# Patient Record
Sex: Male | Born: 1950 | Race: White | Hispanic: No | Smoking: Never smoker
Health system: Southern US, Community
[De-identification: ages and names within clinical notes are randomized; demographics above are authoritative.]

## PROBLEM LIST (undated history)

## (undated) DIAGNOSIS — D649 Anemia, unspecified: Secondary | ICD-10-CM

## (undated) DIAGNOSIS — Z960 Presence of urogenital implants: Secondary | ICD-10-CM

## (undated) DIAGNOSIS — I10 Essential (primary) hypertension: Secondary | ICD-10-CM

## (undated) DIAGNOSIS — E119 Type 2 diabetes mellitus without complications: Secondary | ICD-10-CM

## (undated) DIAGNOSIS — E7800 Pure hypercholesterolemia, unspecified: Secondary | ICD-10-CM

## (undated) DIAGNOSIS — Z8719 Personal history of other diseases of the digestive system: Secondary | ICD-10-CM

## (undated) HISTORY — DX: Type 2 diabetes mellitus without complications: E11.9

---

## 2001-02-10 ENCOUNTER — Ambulatory Visit (HOSPITAL_COMMUNITY): Admission: RE | Admit: 2001-02-10 | Discharge: 2001-02-10 | Payer: Self-pay | Admitting: Pulmonary Disease

## 2004-12-15 ENCOUNTER — Ambulatory Visit (HOSPITAL_COMMUNITY): Admission: RE | Admit: 2004-12-15 | Discharge: 2004-12-15 | Payer: Self-pay | Admitting: Internal Medicine

## 2004-12-15 ENCOUNTER — Ambulatory Visit: Payer: Self-pay | Admitting: Internal Medicine

## 2004-12-15 HISTORY — PX: COLONOSCOPY: SHX174

## 2010-03-18 ENCOUNTER — Encounter: Payer: Self-pay | Admitting: Internal Medicine

## 2010-03-22 HISTORY — PX: COLONOSCOPY: SHX174

## 2010-04-03 ENCOUNTER — Telehealth (INDEPENDENT_AMBULATORY_CARE_PROVIDER_SITE_OTHER): Payer: Self-pay

## 2010-04-18 ENCOUNTER — Ambulatory Visit (HOSPITAL_COMMUNITY): Admission: RE | Admit: 2010-04-18 | Discharge: 2010-04-18 | Payer: Self-pay | Admitting: Internal Medicine

## 2010-04-18 ENCOUNTER — Ambulatory Visit: Payer: Self-pay | Admitting: Internal Medicine

## 2010-07-22 NOTE — Letter (Signed)
Summary: Internal Other Eric Guzman  Internal Other Eric Guzman   Imported By: Cloria Spring LPN 16/03/9603 54:09:81  _____________________________________________________________________  External Attachment:    Type:   Image     Comment:   External Document

## 2010-07-22 NOTE — Progress Notes (Signed)
Summary: review med list/problems  Phone Note Outgoing Call   Call placed by: Doris Call placed to: Patient Summary of Call: I called pt and reviewed med list and prolbems. No change in meds or no new problems. Pt still scheduled for colonoscopy on 04/18/2010. Initial call taken by: Cloria Spring LPN,  April 03, 2010 11:51 AM

## 2010-09-03 LAB — GLUCOSE, CAPILLARY: Glucose-Capillary: 280 mg/dL — ABNORMAL HIGH (ref 70–99)

## 2010-11-07 NOTE — Op Note (Signed)
NAME:  Eric Guzman, Eric Guzman                ACCOUNT NO.:  [id number]   MEDICAL RECORD NO.:  [id number]          PATIENT TYPE:  AMB   LOCATION:  DAY                           FACILITY:  [facility]   PHYSICIAN:  R. Roetta Sessions, M.D. DATE OF BIRTH:  Nov 07, 1950   DATE OF PROCEDURE:  12/15/2004  DATE OF DISCHARGE:                                 OPERATIVE REPORT   PROCEDURE PERFORMED:  Colonoscopy.   INDICATIONS FOR PROCEDURE:  The patient is a 60 year old gentleman devoid of  any lower gastrointestinal tract symptoms, positive family history of colon  cancer in his father, who was diagnosed in his 75s.  He is now brought to  colonoscopy.  Colonoscopy is now being done.  This approach has been  discussed with the patient at length.  Potential risks, benefits and  alternatives have been reviewed, questions have been answered, he is  agreeable.  Please see documentation in the medical records.   PROCEDURE NOTE:  Oxygen saturations, blood pressure, pulse and respirations  were monitored throughout the entire procedure.  Conscious sedation Versed 4  mg IV, Demerol 100 mg IV in divided doses.   INSTRUMENT USED:  Olympus video chip system.   FINDINGS:  Digital exam revealed no abnormalities.   ENDOSCOPIC FINDINGS:  Prep was good.   Rectum:  Examination of the rectal mucosa including retroflex view of the  anal verge revealed only a single anal papilla.  Otherwise rectal mucosa  appeared normal.  Colon:  Colonic mucosa was surveyed from the rectosigmoid junction to the  left, transverse and right colon to the area of the appendiceal orifice and  ileocecal valve and cecum.  These structures were well seen and photographed  for the record.  The ileocecal valve was somewhat subtle but it was indeed  the ileocecal valve as I intubated the terminal ileum to 10 cm.  From this  level, the scope was slowly withdrawn.  All previously mentioned mucosal  surfaces were again seen.  The colonic mucosa appeared  normal.  The terminal  ileum mucosa appeared normal.  The patient tolerated the procedure well, was  reacted in endoscopy.   IMPRESSION:  1.  Anal papilla.  Otherwise, normal rectum.  2.  Normal colon.  3.  Normal terminal ileum.   RECOMMENDATIONS:  Repeat high risk screening colonoscopy in 5 years.       RMR/MEDQ  D:  12/15/2004  T:  12/15/2004  Job:  [id number]   cc:   Corrie Mckusick, M.D.  Fax: [phone]

## 2011-06-23 DIAGNOSIS — Z8719 Personal history of other diseases of the digestive system: Secondary | ICD-10-CM

## 2011-06-23 HISTORY — DX: Personal history of other diseases of the digestive system: Z87.19

## 2011-09-11 ENCOUNTER — Ambulatory Visit (INDEPENDENT_AMBULATORY_CARE_PROVIDER_SITE_OTHER): Payer: BC Managed Care – PPO | Admitting: Urology

## 2011-09-11 DIAGNOSIS — N400 Enlarged prostate without lower urinary tract symptoms: Secondary | ICD-10-CM

## 2011-09-11 DIAGNOSIS — R972 Elevated prostate specific antigen [PSA]: Secondary | ICD-10-CM

## 2011-09-11 DIAGNOSIS — N478 Other disorders of prepuce: Secondary | ICD-10-CM

## 2011-09-11 DIAGNOSIS — N471 Phimosis: Secondary | ICD-10-CM

## 2014-02-14 ENCOUNTER — Encounter: Payer: Self-pay | Admitting: *Deleted

## 2014-03-20 ENCOUNTER — Other Ambulatory Visit: Payer: Self-pay

## 2014-03-20 ENCOUNTER — Encounter: Payer: Self-pay | Admitting: Gastroenterology

## 2014-03-20 ENCOUNTER — Ambulatory Visit (INDEPENDENT_AMBULATORY_CARE_PROVIDER_SITE_OTHER): Payer: Medicare Other | Admitting: Gastroenterology

## 2014-03-20 VITALS — BP 141/73 | HR 97 | Temp 98.4°F | Ht 66.0 in | Wt 163.0 lb

## 2014-03-20 DIAGNOSIS — Z800 Family history of malignant neoplasm of digestive organs: Secondary | ICD-10-CM

## 2014-03-20 DIAGNOSIS — D509 Iron deficiency anemia, unspecified: Secondary | ICD-10-CM

## 2014-03-20 DIAGNOSIS — D649 Anemia, unspecified: Secondary | ICD-10-CM

## 2014-03-20 MED ORDER — PEG-KCL-NACL-NASULF-NA ASC-C 100 G PO SOLR: 1.0000 | ORAL | Status: DC

## 2014-03-20 NOTE — Assessment & Plan Note (Signed)
63 year old male with new onset anemia but without overt signs of GI bleeding. Labs unavailable at time of appointment but have been requested. No concerning upper or lower GI symptoms. Last colonoscopy in 2011 normal; he does have a family history of colon cancer in his father, diagnosed in his late 43s. Recommend at a minimum proceeding with a colonoscopy +/- EGD with Dr. Gala Romney due to new onset anemia. Requesting labs to determine if IDA evident.   Proceed with TCS +/- EGD with Dr. Gala Romney in near future: the risks, benefits, and alternatives have been discussed with the patient in detail. The patient states understanding and desires to proceed.

## 2014-03-20 NOTE — Progress Notes (Signed)
    Primary Care Physician:  GOLDING, JOHN CABOT, MD Primary Gastroenterologist:  Dr. Rourk   Chief Complaint  Patient presents with  . Anemia    HPI:   Eric Guzman presents today secondary to anemia. Labs not available at time of visit. Was told he had low iron. No hematochezia. No melena. No abdominal pain. No changes in bowel habits. Walking, exercising, intentional weight loss of 10 lbs over past year or so. Father diagnosed with colon cancer in his late 70s. Last colonoscopy in 2011 normal.   Past Medical History  Diagnosis Date  . Diabetes     Past Surgical History  Procedure Laterality Date  . Colonoscopy  12/15/2004    RMR:.  Anal papilla.  Otherwise, normal rectum and colon  . Colonoscopy  Oct 2011    Dr. Rourk: normal    Current Outpatient Prescriptions  Medication Sig Dispense Refill  . aspirin 81 MG tablet Take 81 mg by mouth daily.      . atorvastatin (LIPITOR) 20 MG tablet Take 20 mg by mouth daily.      . lisinopril (PRINIVIL,ZESTRIL) 20 MG tablet Take 20 mg by mouth daily.      . Multiple Vitamin (MULTIVITAMIN) tablet Take 1 tablet by mouth daily. WITH IRON      . pioglitazone-metformin (ACTOPLUS MET) 15-850 MG per tablet Take 1 tablet by mouth 3 (three) times daily.      . sitaGLIPtin (JANUVIA) 100 MG tablet Take 100 mg by mouth daily.      . peg 3350 powder (MOVIPREP) 100 G SOLR Take 1 kit (200 g total) by mouth as directed.  1 kit  0   No current facility-administered medications for this visit.    Allergies as of 03/20/2014  . (No Known Allergies)    Family History  Problem Relation Age of Onset  . Colon cancer Father     diagnosed in his 70s, deceased in his 80s    History   Social History  . Marital Status: Single    Spouse Name: N/A    Number of Children: N/A  . 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 Smoking status: Never Smoker   . Smokeless tobacco: Not on  file  . Alcohol Use: No  . Drug Use: No  . Sexual Activity: Not on file   Other Topics Concern  . Not on file   Social History Narrative  . No narrative on file    Review of Systems: Gen: +fatigue CV: Denies chest pain, heart palpitations, peripheral edema, syncope.  Resp: Denies shortness of breath at rest or with exertion. Denies wheezing or cough.  GI: see HPI GU : Denies urinary burning, urinary frequency, urinary hesitancy MS: Denies joint pain, muscle weakness, cramps, or limitation of movement.  Derm: Denies rash, itching, dry skin Psych: Denies depression, anxiety, memory loss, and confusion Heme: Denies bruising, bleeding, and enlarged lymph nodes.  Physical Exam: BP 141/73  Pulse 97  Temp(Src) 98.4 F (36.9 C) (Oral)  Ht 5' 6" (1.676 m)  Wt 163 lb (73.936 kg)  BMI 26.32 kg/m2 General:   Alert and oriented. Pleasant and cooperative. Well-nourished and well-developed.  Head:  Normocephalic and atraumatic. Eyes:  Without icterus, sclera clear and conjunctiva pink.  Ears:  Normal auditory acuity. Nose:  No deformity, discharge,  or lesions. Mouth:  No deformity or lesions, oral mucosa pink.    Lungs:  Clear to auscultation bilaterally. No wheezes, rales, or rhonchi. No distress.  Heart:  S1, S2 present without murmurs appreciated.  Abdomen:  +BS, soft, non-tender and non-distended. No HSM noted. No guarding or rebound. No masses appreciated.  Rectal:  Deferred  Msk:  Symmetrical without gross deformities. Normal posture. Extremities:  Without clubbing or edema. Neurologic:  Alert and  oriented x4;  grossly normal neurologically. Skin:  Intact without significant lesions or rashes. Psych:  Alert and cooperative. Normal mood and affect.    

## 2014-03-20 NOTE — Patient Instructions (Signed)
We have scheduled you for a colonoscopy with a possible upper endoscopy with Dr. Gala Romney.  Further recommendations to follow.

## 2014-03-20 NOTE — Progress Notes (Addendum)
Received outside labs. Hgb normal at 14 in Feb 2015. Most current dated Aug 2015. Normocytic anemia. Hgb 11.7. Hct 35.1. Ferritin normal at 71, iron marginally low at 41. TIBC normal at 332. %sat low at 12. I do not have previous iron studies to compare. He has dropped close to 3 grams in 6 months. Proceed with TCS+/- EGD.

## 2014-03-20 NOTE — Assessment & Plan Note (Signed)
In 1st degree relative but at an advanced age. Last 2 colonoscopies normal. Due to new onset anemia, proceeding with early interval colonoscopy.

## 2014-03-20 NOTE — Progress Notes (Signed)
cc'ed to pcp °

## 2014-04-03 ENCOUNTER — Encounter (HOSPITAL_COMMUNITY): Payer: Self-pay | Admitting: Pharmacy Technician

## 2014-04-11 ENCOUNTER — Encounter (HOSPITAL_COMMUNITY): Payer: Self-pay | Admitting: *Deleted

## 2014-04-11 ENCOUNTER — Encounter (HOSPITAL_COMMUNITY)
Admission: RE | Disposition: A | Discharge status: Home / Self Care | Payer: Self-pay | Source: Ambulatory Visit | Attending: Internal Medicine

## 2014-04-11 ENCOUNTER — Ambulatory Visit (HOSPITAL_COMMUNITY)
Admission: RE | Admit: 2014-04-11 | Discharge: 2014-04-11 | Disposition: A | Discharge status: Home / Self Care | Payer: Medicare Other | Source: Ambulatory Visit | Attending: Internal Medicine | Admitting: Internal Medicine

## 2014-04-11 DIAGNOSIS — D509 Iron deficiency anemia, unspecified: Secondary | ICD-10-CM | POA: Diagnosis present

## 2014-04-11 DIAGNOSIS — K2970 Gastritis, unspecified, without bleeding: Secondary | ICD-10-CM | POA: Insufficient documentation

## 2014-04-11 DIAGNOSIS — E119 Type 2 diabetes mellitus without complications: Secondary | ICD-10-CM | POA: Insufficient documentation

## 2014-04-11 DIAGNOSIS — K2100 Gastro-esophageal reflux disease with esophagitis, without bleeding: Secondary | ICD-10-CM

## 2014-04-11 DIAGNOSIS — K648 Other hemorrhoids: Secondary | ICD-10-CM | POA: Diagnosis not present

## 2014-04-11 DIAGNOSIS — Z7982 Long term (current) use of aspirin: Secondary | ICD-10-CM | POA: Insufficient documentation

## 2014-04-11 DIAGNOSIS — Z800 Family history of malignant neoplasm of digestive organs: Secondary | ICD-10-CM | POA: Insufficient documentation

## 2014-04-11 DIAGNOSIS — K257 Chronic gastric ulcer without hemorrhage or perforation: Secondary | ICD-10-CM

## 2014-04-11 DIAGNOSIS — K259 Gastric ulcer, unspecified as acute or chronic, without hemorrhage or perforation: Secondary | ICD-10-CM | POA: Insufficient documentation

## 2014-04-11 HISTORY — DX: Pure hypercholesterolemia, unspecified: E78.00

## 2014-04-11 HISTORY — DX: Anemia, unspecified: D64.9

## 2014-04-11 HISTORY — PX: COLONOSCOPY: SHX5424

## 2014-04-11 HISTORY — PX: ESOPHAGOGASTRODUODENOSCOPY: SHX5428

## 2014-04-11 HISTORY — DX: Essential (primary) hypertension: I10

## 2014-04-11 LAB — GLUCOSE, CAPILLARY: Glucose-Capillary: 142 mg/dL — ABNORMAL HIGH (ref 70–99)

## 2014-04-11 SURGERY — COLONOSCOPY
Anesthesia: Moderate Sedation

## 2014-04-11 MED ORDER — MEPERIDINE HCL 100 MG/ML IJ SOLN
INTRAMUSCULAR | Status: AC
  Filled 2014-04-11: qty 2

## 2014-04-11 MED ORDER — MIDAZOLAM HCL 5 MG/5ML IJ SOLN
INTRAMUSCULAR | Status: AC
  Filled 2014-04-11: qty 10

## 2014-04-11 MED ORDER — ONDANSETRON HCL 4 MG/2ML IJ SOLN
INTRAMUSCULAR | Status: DC | PRN
  Administered 2014-04-11: 4 mg via INTRAVENOUS

## 2014-04-11 MED ORDER — ONDANSETRON HCL 4 MG/2ML IJ SOLN
INTRAMUSCULAR | Status: AC
  Filled 2014-04-11: qty 2

## 2014-04-11 MED ORDER — LIDOCAINE VISCOUS 2 % MT SOLN
OROMUCOSAL | Status: DC | PRN
  Administered 2014-04-11: 4 mL via OROMUCOSAL

## 2014-04-11 MED ORDER — SODIUM CHLORIDE 0.9 % IV SOLN
INTRAVENOUS | Status: DC
  Administered 2014-04-11: 09:00:00 via INTRAVENOUS

## 2014-04-11 MED ORDER — STERILE WATER FOR IRRIGATION IR SOLN
Status: DC | PRN
  Administered 2014-04-11: 09:00:00

## 2014-04-11 MED ORDER — LIDOCAINE VISCOUS 2 % MT SOLN
OROMUCOSAL | Status: AC
  Filled 2014-04-11: qty 15

## 2014-04-11 MED ORDER — MEPERIDINE HCL 100 MG/ML IJ SOLN
INTRAMUSCULAR | Status: DC | PRN
  Administered 2014-04-11: 25 mg via INTRAVENOUS
  Administered 2014-04-11: 50 mg via INTRAVENOUS
  Administered 2014-04-11: 25 mg via INTRAVENOUS

## 2014-04-11 MED ORDER — MIDAZOLAM HCL 5 MG/5ML IJ SOLN
INTRAMUSCULAR | Status: DC | PRN
  Administered 2014-04-11: 1 mg via INTRAVENOUS
  Administered 2014-04-11 (×2): 2 mg via INTRAVENOUS

## 2014-04-11 NOTE — Op Note (Signed)
[facility] [address] [place], [address]   ENDOSCOPY PROCEDURE REPORT  PATIENT: Eric, Guzman  MR#: [id number] BIRTHDATE: 1950-07-30 , 1  yrs. old GENDER: male ENDOSCOPIST: R.  Garfield Cornea, MD FACP FACG REFERRED BY:  Sharilyn Sites, M.D. PROCEDURE DATE:  2014-04-14 PROCEDURE:  EGD w/ biopsy INDICATIONS:  anemia-mixed iron deficiency; negative colonoscopy. MEDICATIONS: Versed 5 mg IV and Demerol 100 mg IV in divided doses. Xylocaine gel orally ASA CLASS:      Class II  CONSENT: The risks, benefits, limitations, alternatives and imponderables have been discussed.  The potential for biopsy, esophogeal dilation, etc. have also been reviewed.  Questions have been answered.  All parties agreeable.  Please see the history and physical in the medical record for more information.  DESCRIPTION OF PROCEDURE: After the risks benefits and alternatives of the procedure were thoroughly explained, informed consent was obtained.  The EC-3890Li ([id number]) endoscope was introduced through the mouth and advanced to the second portion of the duodenum , limited by Without limitations. The instrument was slowly withdrawn as the mucosa was fully examined.    Examination of the soft tissue mucosa revealed a 5 mm inserted "V." erosions straddling the GE junction.  No Barrett's esophagus.  The tumor.  Stomach empty.  (1) 8 mm stellate shaped ulcer crater in the body mucosa along the proximal greater curvature.  It appeared to be benign.  The remainder of the gastric mucosa appeared normal. Patent pylorus.  Examination bulb and second were multiple erosions scattered throughout the posterior bulb and second portion of the duodenum.  Biopsies the gastric ulcer and abnormal duodenum taken for histologic study.  Retroflexed views revealed as previously described.     The scope was then withdrawn from the patient and the procedure completed.  COMPLICATIONS: There were no  immediate complications.  ENDOSCOPIC IMPRESSION: Erosive reflux esophagitis. Gastric ulcer  -  status post biopsy. Duodenal erosions-status post biopsy  RECOMMENDATIONS: Avoid nonsteroidals much as possible. Begin Protonix 40 mg twice daily. Followup on pathology. Avoid nonsteroidal agents as much as possible. See colonoscopy report.  REPEAT EXAM:  eSigned:  R. Garfield Cornea, MD Rosalita Chessman [facility] 14-Apr-2014 9:59 AM    CC:  CPT CODES: ICD CODES:  The ICD and CPT codes recommended by this software are interpretations from the data that the clinical staff has captured with the software.  The verification of the translation of this report to the ICD and CPT codes and modifiers is the sole responsibility of the health care institution and practicing physician where this report was generated.  [place]. will not be held responsible for the validity of the ICD and CPT codes included on this report.  AMA assumes no liability for data contained or not contained herein. CPT is a Designer, television/film set of the [employer].  PATIENT NAME:  Eric, Guzman MR#: [id number]

## 2014-04-11 NOTE — Interval H&P Note (Signed)
History and Physical Interval Note:  04/11/2014 9:07 AM  Eric Guzman  has presented today for surgery, with the diagnosis of anemia  The various methods of treatment have been discussed with the patient and family. After consideration of risks, benefits and other options for treatment, the patient has consented to  Procedure(s) with comments: COLONOSCOPY (N/A) - 0930 ESOPHAGOGASTRODUODENOSCOPY (EGD) (N/A) as a surgical intervention .  The patient's history has been reviewed, patient examined, no change in status, stable for surgery.  I have reviewed the patient's chart and labs.  Questions were answered to the patient's satisfaction.     Trinna Kunst  No change. Colonoscopy possible EGD to follow.The risks, benefits, limitations, imponderables and alternatives regarding both EGD and colonoscopy have been reviewed with the patient. Questions have been answered. All parties agreeable.

## 2014-04-11 NOTE — H&P (View-Only) (Signed)
    Primary Care Physician:  GOLDING, JOHN CABOT, MD Primary Gastroenterologist:  Dr. Rourk   Chief Complaint  Patient presents with  . Anemia    HPI:   Ming C Schwer presents today secondary to anemia. Labs not available at time of visit. Was told he had low iron. No hematochezia. No melena. No abdominal pain. No changes in bowel habits. Walking, exercising, intentional weight loss of 10 lbs over past year or so. Father diagnosed with colon cancer in his late 70s. Last colonoscopy in 2011 normal.   Past Medical History  Diagnosis Date  . Diabetes     Past Surgical History  Procedure Laterality Date  . Colonoscopy  12/15/2004    RMR:.  Anal papilla.  Otherwise, normal rectum and colon  . Colonoscopy  Oct 2011    Dr. Rourk: normal    Current Outpatient Prescriptions  Medication Sig Dispense Refill  . aspirin 81 MG tablet Take 81 mg by mouth daily.      . atorvastatin (LIPITOR) 20 MG tablet Take 20 mg by mouth daily.      . lisinopril (PRINIVIL,ZESTRIL) 20 MG tablet Take 20 mg by mouth daily.      . Multiple Vitamin (MULTIVITAMIN) tablet Take 1 tablet by mouth daily. WITH IRON      . pioglitazone-metformin (ACTOPLUS MET) 15-850 MG per tablet Take 1 tablet by mouth 3 (three) times daily.      . sitaGLIPtin (JANUVIA) 100 MG tablet Take 100 mg by mouth daily.      . peg 3350 powder (MOVIPREP) 100 G SOLR Take 1 kit (200 g total) by mouth as directed.  1 kit  0   No current facility-administered medications for this visit.    Allergies as of 03/20/2014  . (No Known Allergies)    Family History  Problem Relation Age of Onset  . Colon cancer Father     diagnosed in his 70s, deceased in his 80s    History   Social History  . Marital Status: Single    Spouse Name: N/A    Number of Children: N/A  . 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 Smoking status: Never Smoker   . Smokeless tobacco: Not on  file  . Alcohol Use: No  . Drug Use: No  . Sexual Activity: Not on file   Other Topics Concern  . Not on file   Social History Narrative  . No narrative on file    Review of Systems: Gen: +fatigue CV: Denies chest pain, heart palpitations, peripheral edema, syncope.  Resp: Denies shortness of breath at rest or with exertion. Denies wheezing or cough.  GI: see HPI GU : Denies urinary burning, urinary frequency, urinary hesitancy MS: Denies joint pain, muscle weakness, cramps, or limitation of movement.  Derm: Denies rash, itching, dry skin Psych: Denies depression, anxiety, memory loss, and confusion Heme: Denies bruising, bleeding, and enlarged lymph nodes.  Physical Exam: BP 141/73  Pulse 97  Temp(Src) 98.4 F (36.9 C) (Oral)  Ht 5' 6" (1.676 m)  Wt 163 lb (73.936 kg)  BMI 26.32 kg/m2 General:   Alert and oriented. Pleasant and cooperative. Well-nourished and well-developed.  Head:  Normocephalic and atraumatic. Eyes:  Without icterus, sclera clear and conjunctiva pink.  Ears:  Normal auditory acuity. Nose:  No deformity, discharge,  or lesions. Mouth:  No deformity or lesions, oral mucosa pink.    Lungs:  Clear to auscultation bilaterally. No wheezes, rales, or rhonchi. No distress.  Heart:  S1, S2 present without murmurs appreciated.  Abdomen:  +BS, soft, non-tender and non-distended. No HSM noted. No guarding or rebound. No masses appreciated.  Rectal:  Deferred  Msk:  Symmetrical without gross deformities. Normal posture. Extremities:  Without clubbing or edema. Neurologic:  Alert and  oriented x4;  grossly normal neurologically. Skin:  Intact without significant lesions or rashes. Psych:  Alert and cooperative. Normal mood and affect.    

## 2014-04-11 NOTE — Op Note (Signed)
[facility] [address] [place], [address]   COLONOSCOPY PROCEDURE REPORT  PATIENT: Eric Guzman, Eric Guzman  MR#: [id number] BIRTHDATE: [birth year] , 58  yrs. old GENDER: male ENDOSCOPIST: R.  Garfield Cornea, MD FACP [facility] REFERRED EH:UDJS Hilma Favors, M.D. PROCEDURE DATE:  05/02/14 PROCEDURE:   Colonoscopy, diagnostic INDICATIONS:mixed iron deficiency anemia; positive family history of colon cancer in a first relative but at an advanced age; Hemoccult negative stool. MEDICATIONS: Versed and 4 mg daily and Demerol 75 mg IV in divided doses.  Zofran 4 mg IV ASA CLASS:       Class II  CONSENT: The risks, benefits, alternatives and imponderables including but not limited to bleeding, perforation as well as the possibility of a missed lesion have been reviewed.  The potential for biopsy, lesion removal, etc. have also been discussed. Questions have been answered.  All parties agreeable.  Please see the history and physical in the medical record for more information.  DESCRIPTION OF PROCEDURE:   After the risks benefits and alternatives of the procedure were thoroughly explained, informed consent was obtained.  The digital rectal exam revealed no abnormalities of the rectum.   The EC-3890Li ([id number])  endoscope was introduced through the anus and advanced to the terminal ileum which was intubated for a short distance. No adverse events experienced.   The quality of the prep was adequate.  The instrument was then slowly withdrawn as the colon was fully examined.      COLON FINDINGS: Anal papilla and internal hemorrhoids; otherwise normal-appearing rectal mucosa.  Normal-appearing colonic mucosa. The distal 10 cm of terminal ileal mucosa also appeared normal. Retroflexion was performed. .  Withdrawal time=7 minutes 0 seconds.  The scope was withdrawn and the procedure completed. COMPLICATIONS: There were no immediate complications.  ENDOSCOPIC IMPRESSION: Anal  papilla and internal hemorrhoids; otherwise normal ileocolonoscopy  RECOMMENDATIONS: See EGD report  eSigned:  R. Garfield Cornea, MD Rosalita Chessman [facility] 05/02/14 9:38 AM   cc:  CPT CODES: ICD CODES:  The ICD and CPT codes recommended by this software are interpretations from the data that the clinical staff has captured with the software.  The verification of the translation of this report to the ICD and CPT codes and modifiers is the sole responsibility of the health care institution and practicing physician where this report was generated.  [place]. will not be held responsible for the validity of the ICD and CPT codes included on this report.  AMA assumes no liability for data contained or not contained herein. CPT is a Designer, television/film set of the [employer].  PATIENT NAME:  Eric Guzman, Eric Guzman MR#: [id number]

## 2014-04-11 NOTE — Discharge Instructions (Signed)
Colonoscopy Discharge Instructions  Read the instructions outlined below and refer to this sheet in the next few weeks. These discharge instructions provide you with general information on caring for yourself after you leave the hospital. Your doctor may also give you specific instructions. While your treatment has been planned according to the most current medical practices available, unavoidable complications occasionally occur. If you have any problems or questions after discharge, call Dr. Gala Romney at [phone]. ACTIVITY  You may resume your regular activity, but move at a slower pace for the next 24 hours.   Take frequent rest periods for the next 24 hours.   Walking will help get rid of the air and reduce the bloated feeling in your belly (abdomen).   No driving for 24 hours (because of the medicine (anesthesia) used during the test).    Do not sign any important legal documents or operate any machinery for 24 hours (because of the anesthesia used during the test).  NUTRITION  Drink plenty of fluids.   You may resume your normal diet as instructed by your doctor.   Begin with a light meal and progress to your normal diet. Heavy or fried foods are harder to digest and may make you feel sick to your stomach (nauseated).   Avoid alcoholic beverages for 24 hours or as instructed.  MEDICATIONS  You may resume your normal medications unless your doctor tells you otherwise.  WHAT YOU CAN EXPECT TODAY  Some feelings of bloating in the abdomen.   Passage of more gas than usual.   Spotting of blood in your stool or on the toilet paper.  IF YOU HAD POLYPS REMOVED DURING THE COLONOSCOPY:  No aspirin products for 7 days or as instructed.   No alcohol for 7 days or as instructed.   Eat a soft diet for the next 24 hours.  FINDING OUT THE RESULTS OF YOUR TEST Not all test results are available during your visit. If your test results are not back during the visit, make an appointment  with your caregiver to find out the results. Do not assume everything is normal if you have not heard from your caregiver or the medical facility. It is important for you to follow up on all of your test results.  SEEK IMMEDIATE MEDICAL ATTENTION IF:  You have more than a spotting of blood in your stool.   Your belly is swollen (abdominal distention).   You are nauseated or vomiting.   You have a temperature over 101.   You have abdominal pain or discomfort that is severe or gets worse throughout the day.  EGD Discharge instructions Please read the instructions outlined below and refer to this sheet in the next few weeks. These discharge instructions provide you with general information on caring for yourself after you leave the hospital. Your doctor may also give you specific instructions. While your treatment has been planned according to the most current medical practices available, unavoidable complications occasionally occur. If you have any problems or questions after discharge, please call your doctor. ACTIVITY You may resume your regular activity but move at a slower pace for the next 24 hours.  Take frequent rest periods for the next 24 hours.  Walking will help expel (get rid of) the air and reduce the bloated feeling in your abdomen.  No driving for 24 hours (because of the anesthesia (medicine) used during the test).  You may shower.  Do not sign any important legal documents or operate any machinery  for 24 hours (because of the anesthesia used during the test).  NUTRITION Drink plenty of fluids.  You may resume your normal diet.  Begin with a light meal and progress to your normal diet.  Avoid alcoholic beverages for 24 hours or as instructed by your caregiver.  MEDICATIONS You may resume your normal medications unless your caregiver tells you otherwise.  WHAT YOU CAN EXPECT TODAY You may experience abdominal discomfort such as a feeling of fullness or gas pains.    FOLLOW-UP Your doctor will discuss the results of your test with you.  SEEK IMMEDIATE MEDICAL ATTENTION IF ANY OF THE FOLLOWING OCCUR: Excessive nausea (feeling sick to your stomach) and/or vomiting.  Severe abdominal pain and distention (swelling).  Trouble swallowing.  Temperature over 101 F (37.8 C).  Rectal bleeding or vomiting of blood.    Peptic ulcer and GERD information provided  Begin Protonix 40 mg twice daily  Further recommendations to follow pending review of pathology report  Avoid nonsteroidal agents including aspirin as much as possible     Peptic Ulcer A peptic ulcer is a sore in the lining of your esophagus (esophageal ulcer), stomach (gastric ulcer), or in the first part of your small intestine (duodenal ulcer). The ulcer causes erosion into the deeper tissue. CAUSES  Normally, the lining of the stomach and the small intestine protects itself from the acid that digests food. The protective lining can be damaged by:  An infection caused by a bacterium called Helicobacter pylori (H. pylori).  Regular use of nonsteroidal anti-inflammatory drugs (NSAIDs), such as ibuprofen or aspirin.  Smoking tobacco. Other risk factors include being older than 7, drinking alcohol excessively, and having a family history of ulcer disease.  SYMPTOMS   Burning pain or gnawing in the area between the chest and the belly button.  Heartburn.  Nausea and vomiting.  Bloating. The pain can be worse on an empty stomach and at night. If the ulcer results in bleeding, it can cause:  Black, tarry stools.  Vomiting of bright red blood.  Vomiting of coffee-ground-looking materials. DIAGNOSIS  A diagnosis is usually made based upon your history and an exam. Other tests and procedures may be performed to find the cause of the ulcer. Finding a cause will help determine the best treatment. Tests and procedures may include:  Blood tests, stool tests, or breath tests to check for  the bacterium H. pylori.  An upper gastrointestinal (GI) series of the esophagus, stomach, and small intestine.  An endoscopy to examine the esophagus, stomach, and small intestine.  A biopsy. TREATMENT  Treatment may include:  Eliminating the cause of the ulcer, such as smoking, NSAIDs, or alcohol.  Medicines to reduce the amount of acid in your digestive tract.  Antibiotic medicines if the ulcer is caused by the H. pylori bacterium.  An upper endoscopy to treat a bleeding ulcer.  Surgery if the bleeding is severe or if the ulcer created a hole somewhere in the digestive system. HOME CARE INSTRUCTIONS   Avoid tobacco, alcohol, and caffeine. Smoking can increase the acid in the stomach, and continued smoking will impair the healing of ulcers.  Avoid foods and drinks that seem to cause discomfort or aggravate your ulcer.  Only take medicines as directed by your caregiver. Do not substitute over-the-counter medicines for prescription medicines without talking to your caregiver.  Keep any follow-up appointments and tests as directed. SEEK MEDICAL CARE IF:   Your do not improve within 7 days of starting treatment.  You have ongoing indigestion or heartburn. SEEK IMMEDIATE MEDICAL CARE IF:     You have sudden, sharp, or persistent abdominal pain.  You have bloody or dark black, tarry stools.  You vomit blood or vomit that looks like coffee grounds.  You become light-headed, weak, or feel faint.  You become sweaty or clammy. MAKE SURE YOU:   Understand these instructions.  Will watch your condition.  Will get help right away if you are not doing well or get worse. Document Released: 06/05/2000 Document Revised: 10/23/2013 Document Reviewed: 01/06/2012  Gastroesophageal Reflux Disease, Adult Gastroesophageal reflux disease (GERD) happens when acid from your stomach flows up into the esophagus. When acid comes in contact with the esophagus, the acid causes soreness  (inflammation) in the esophagus. Over time, GERD may create small holes (ulcers) in the lining of the esophagus. CAUSES   Increased body weight. This puts pressure on the stomach, making acid rise from the stomach into the esophagus.  Smoking. This increases acid production in the stomach.  Drinking alcohol. This causes decreased pressure in the lower esophageal sphincter (valve or ring of muscle between the esophagus and stomach), allowing acid from the stomach into the esophagus.  Late evening meals and a full stomach. This increases pressure and acid production in the stomach.  A malformed lower esophageal sphincter. Sometimes, no cause is found. SYMPTOMS   Burning pain in the lower part of the mid-chest behind the breastbone and in the mid-stomach area. This may occur twice a week or more often.  Trouble swallowing.  Sore throat.  Dry cough.  Asthma-like symptoms including chest tightness, shortness of breath, or wheezing. DIAGNOSIS  Your caregiver may be able to diagnose GERD based on your symptoms. In some cases, X-rays and other tests may be done to check for complications or to check the condition of your stomach and esophagus. TREATMENT  Your caregiver may recommend over-the-counter or prescription medicines to help decrease acid production. Ask your caregiver before starting or adding any new medicines.  HOME CARE INSTRUCTIONS   Change the factors that you can control. Ask your caregiver for guidance concerning weight loss, quitting smoking, and alcohol consumption.  Avoid foods and drinks that make your symptoms worse, such as:  Caffeine or alcoholic drinks.  Chocolate.  Peppermint or mint flavorings.  Garlic and onions.  Spicy foods.  Citrus fruits, such as oranges, lemons, or limes.  Tomato-based foods such as sauce, chili, salsa, and pizza.  Fried and fatty foods.  Avoid lying down for the 3 hours prior to your bedtime or prior to taking a nap.  Eat  small, frequent meals instead of large meals.  Wear loose-fitting clothing. Do not wear anything tight around your waist that causes pressure on your stomach.  Raise the head of your bed 6 to 8 inches with wood blocks to help you sleep. Extra pillows will not help.  Only take over-the-counter or prescription medicines for pain, discomfort, or fever as directed by your caregiver.  Do not take aspirin, ibuprofen, or other nonsteroidal anti-inflammatory drugs (NSAIDs). SEEK IMMEDIATE MEDICAL CARE IF:   You have pain in your arms, neck, jaw, teeth, or back.  Your pain increases or changes in intensity or duration.  You develop nausea, vomiting, or sweating (diaphoresis).  You develop shortness of breath, or you faint.  Your vomit is green, yellow, black, or looks like coffee grounds or blood.  Your stool is red, bloody, or black. These symptoms could be signs of other problems, such  as heart disease, gastric bleeding, or esophageal bleeding. MAKE SURE YOU:   Understand these instructions.  Will watch your condition.  Will get help right away if you are not doing well or get worse. Document Released: 03/18/2005 Document Revised: 08/31/2011 Document Reviewed: 12/26/2010 [facility] Patient Information 2015 [place], [place]. This information is not intended to replace advice given to you by your health care provider. Make sure you discuss any questions you have with your health care provider.  [employer] Patient Information 2015 [place]. This information is not intended to replace advice given to you by your health care provider. Make sure you discuss any questions you have with your health care provider.

## 2014-04-13 ENCOUNTER — Encounter (HOSPITAL_COMMUNITY): Payer: Self-pay | Admitting: Internal Medicine

## 2014-04-15 ENCOUNTER — Encounter: Payer: Self-pay | Admitting: Internal Medicine

## 2014-04-16 ENCOUNTER — Telehealth: Payer: Self-pay

## 2014-04-16 ENCOUNTER — Encounter: Payer: Self-pay | Admitting: Internal Medicine

## 2014-04-16 NOTE — Telephone Encounter (Signed)
Letter mailed to pt.  

## 2014-04-16 NOTE — Telephone Encounter (Signed)
Letter from: Daneil Dolin  Reason for Letter: Results Review  Send letter to patient.  Send copy of letter with path to referring provider and PCP.   Needs ov w us3 months

## 2014-04-16 NOTE — Telephone Encounter (Signed)
APPT MADE, LETTER SENT °

## 2014-05-03 LAB — CBC
%SAT: 12
HCT: 42 %
HEMATOCRIT: 35 %
HEMOGLOBIN: 11.7 g/dL
HGB: 14.1 g/dL
IRON: 41
TIBC: 332
UIBC: 291

## 2014-07-17 ENCOUNTER — Ambulatory Visit (INDEPENDENT_AMBULATORY_CARE_PROVIDER_SITE_OTHER): Payer: Medicare Other | Admitting: Gastroenterology

## 2014-07-17 ENCOUNTER — Other Ambulatory Visit: Payer: Self-pay

## 2014-07-17 ENCOUNTER — Encounter: Payer: Self-pay | Admitting: Gastroenterology

## 2014-07-17 VITALS — BP 124/71 | HR 96 | Temp 97.4°F | Ht 66.0 in | Wt 157.8 lb

## 2014-07-17 DIAGNOSIS — K279 Peptic ulcer, site unspecified, unspecified as acute or chronic, without hemorrhage or perforation: Secondary | ICD-10-CM

## 2014-07-17 DIAGNOSIS — D649 Anemia, unspecified: Secondary | ICD-10-CM

## 2014-07-17 NOTE — Progress Notes (Signed)
Referring Provider: Sharilyn Sites, MD Primary Care Physician:  Purvis Kilts, MD Primary GI: Dr. Gala Romney   Chief Complaint  Patient presents with  . Follow-up    HPI:   Eric Guzman is a 64 y.o. male presenting today with a history normocytic anemia, normal ferritin and low normal iron at 41. Due to dropping Hgb from 14 in Feb 2015 last year to 11.7 in Aug 2015, he underwent EGD/TCS. PUD  noted on EGD in Oct 2015. Negative H.pylori. Presents today to set up surveillance EGD to document ulcer healing. Only belches with exercise. Otherwise no reflux symptoms. No constipation or diarrhea. Protonix BID. Colonoscopy up-to-date as of Oct 2015. Next screening 2020 due to family history of colon cancer.   Past Medical History  Diagnosis Date  . Diabetes   . Hypercholesteremia   . Anemia     Past Surgical History  Procedure Laterality Date  . Colonoscopy  12/15/2004    RMR:.  Anal papilla.  Otherwise, normal rectum and colon  . Colonoscopy  Oct 2011    Dr. Gala Romney: normal  . Colonoscopy N/A 04/11/2014    Dr. Gala Romney: Anal papilla and internal hemorroids; otherwise normal ileocolonoscopy  . Esophagogastroduodenoscopy N/A 04/11/2014    Dr. Gala Romney: erosive reflux esophagitis.  Gastric ulcer- status post biopsy. duodenal erosions-status post biopsy, negative H.pylori    Current Outpatient Prescriptions  Medication Sig Dispense Refill  . atorvastatin (LIPITOR) 20 MG tablet Take 20 mg by mouth daily.    Marland Kitchen lisinopril (PRINIVIL,ZESTRIL) 20 MG tablet Take 20 mg by mouth daily.    . Multiple Vitamin (MULTIVITAMIN) tablet Take 1 tablet by mouth daily. WITH IRON    . pantoprazole (PROTONIX) 40 MG tablet Take 40 mg by mouth 2 (two) times daily.    . pioglitazone-metformin (ACTOPLUS MET) 15-850 MG per tablet Take 1 tablet by mouth 3 (three) times daily.    . sitaGLIPtin (JANUVIA) 100 MG tablet Take 100 mg by mouth daily.     No current facility-administered medications for this visit.     Allergies as of 07/17/2014  . (No Known Allergies)    Family History  Problem Relation Age of Onset  . Colon cancer Father     diagnosed in his 27s, deceased in his 57s    History   Social History  . Marital Status: Widowed    Spouse Name: N/A    Number of Children: N/A  . Years of Education: N/A   Occupational History  . retired     Dealer   Social History Main Topics  . Smoking status: Never Smoker   . Smokeless tobacco: None  . Alcohol Use: No  . Drug Use: No  . Sexual Activity: None   Other Topics Concern  . None   Social History Narrative    Review of Systems: As mentioned in HPI.   Physical Exam: BP 124/71 mmHg  Pulse 96  Temp(Src) 97.4 F (36.3 C) (Oral)  Ht _0  (1.676 m)  Wt 157 lb 12.8 oz (71.578 kg)  BMI 25.48 kg/m2 General:   Alert and oriented. No distress noted. Pleasant and cooperative.  Head:  Normocephalic and atraumatic. Eyes:  Conjuctiva clear without scleral icterus. Mouth:  Oral mucosa pink and moist. Good dentition. No lesions. Heart:  S1, S2 present without murmurs, rubs, or gallops. Regular rate and rhythm. Abdomen:  +BS, soft, non-tender and non-distended. No rebound or guarding. No HSM or masses noted. Msk:  Symmetrical without gross  deformities. Normal posture. Extremities:  Without edema. Neurologic:  Alert and  oriented x4;  grossly normal neurologically. Skin:  Intact without significant lesions or rashes. Psych:  Alert and cooperative. Normal mood and affect.  Lab Results  Component Value Date   HGB 11.7 02/05/2014   HCT 35 02/05/2014   Lab Results  Component Value Date   IRON 41 02/05/2014   TIBC 332 02/05/2014

## 2014-07-17 NOTE — Patient Instructions (Signed)
We have set you up for an upper endoscopy with Dr. Gala Romney to verify ulcer healing.  Further recommendations to follow.

## 2014-07-24 ENCOUNTER — Telehealth: Payer: Self-pay | Admitting: Gastroenterology

## 2014-07-24 ENCOUNTER — Encounter: Payer: Self-pay | Admitting: Gastroenterology

## 2014-07-24 NOTE — Telephone Encounter (Signed)
Let's check CBC, iron, ferritin now. I forgot to order this at his visit. Thanks!

## 2014-07-24 NOTE — Assessment & Plan Note (Signed)
64 year old male with recent findings of PUD on EGD in Oct 2015, likely NSAID related. Negative H.pylori on file. Needs surveillance EGD to document ulcer healing. Will continue PPI BID until EGD completed, then likely down to once daily.   Proceed with upper endoscopy in the near future with Dr. Gala Romney. The risks, benefits, and alternatives have been discussed in detail with patient. They have stated understanding and desire to proceed.

## 2014-07-24 NOTE — Assessment & Plan Note (Signed)
Normocytic mild anemia, with PUD likely explaining. Recheck CBC, iron, ferritin now. Colonoscopy up-to-date and due again in 2020.

## 2014-07-25 ENCOUNTER — Other Ambulatory Visit: Payer: Self-pay

## 2014-07-25 ENCOUNTER — Other Ambulatory Visit: Payer: Self-pay | Admitting: Gastroenterology

## 2014-07-25 DIAGNOSIS — D649 Anemia, unspecified: Secondary | ICD-10-CM

## 2014-07-25 NOTE — Telephone Encounter (Signed)
Mailed letter and lab order to the pt.

## 2014-07-30 ENCOUNTER — Telehealth: Payer: Self-pay | Admitting: Internal Medicine

## 2014-07-30 NOTE — Telephone Encounter (Signed)
PATIENT CAME IN OFFICE INQUIRING ABOUT LABS HE RECEIVED PAPER WORK FOR.  WANTS TO KNOW IF YOU RECEIVED THE LAB RESULTS FROM January HE HAD AT FROM GOLDING'S OFFICE.  IF HE STILL NEEDS TO HAVE WHAT WAS MAILED TO HIM PLEASE CALL AND LET HIM KNOW.  [phone]  CELL [phone]

## 2014-08-01 NOTE — Progress Notes (Signed)
cc'ed to pcp °

## 2014-08-03 NOTE — Telephone Encounter (Signed)
Labs from [facility] are in Anna's cart. I tried to call both pts home number and cell number- NA and could not leave a message.   Vicente Males, please check and see if you still need these labs for the pt.

## 2014-08-07 NOTE — Telephone Encounter (Signed)
Yes, he needs iron, ferritin, TIBC. I only have CBC. Hgb 12.27 Jun 2014. Normocytic anemia.

## 2014-08-08 ENCOUNTER — Encounter (HOSPITAL_COMMUNITY)
Admission: RE | Disposition: A | Discharge status: Home / Self Care | Payer: Self-pay | Source: Ambulatory Visit | Attending: Internal Medicine

## 2014-08-08 ENCOUNTER — Encounter (HOSPITAL_COMMUNITY): Payer: Self-pay | Admitting: *Deleted

## 2014-08-08 ENCOUNTER — Ambulatory Visit (HOSPITAL_COMMUNITY)
Admission: RE | Admit: 2014-08-08 | Discharge: 2014-08-08 | Disposition: A | Discharge status: Home / Self Care | Payer: Medicare Other | Source: Ambulatory Visit | Attending: Internal Medicine | Admitting: Internal Medicine

## 2014-08-08 DIAGNOSIS — E119 Type 2 diabetes mellitus without complications: Secondary | ICD-10-CM | POA: Diagnosis not present

## 2014-08-08 DIAGNOSIS — E780 Pure hypercholesterolemia: Secondary | ICD-10-CM | POA: Diagnosis not present

## 2014-08-08 DIAGNOSIS — D649 Anemia, unspecified: Secondary | ICD-10-CM | POA: Insufficient documentation

## 2014-08-08 DIAGNOSIS — K279 Peptic ulcer, site unspecified, unspecified as acute or chronic, without hemorrhage or perforation: Secondary | ICD-10-CM | POA: Insufficient documentation

## 2014-08-08 DIAGNOSIS — Z8711 Personal history of peptic ulcer disease: Secondary | ICD-10-CM | POA: Diagnosis not present

## 2014-08-08 HISTORY — PX: ESOPHAGOGASTRODUODENOSCOPY: SHX5428

## 2014-08-08 LAB — GLUCOSE, CAPILLARY: Glucose-Capillary: 163 mg/dL — ABNORMAL HIGH (ref 70–99)

## 2014-08-08 SURGERY — EGD (ESOPHAGOGASTRODUODENOSCOPY)
Anesthesia: Moderate Sedation

## 2014-08-08 MED ORDER — MIDAZOLAM HCL 5 MG/5ML IJ SOLN
INTRAMUSCULAR | Status: AC
  Filled 2014-08-08: qty 10

## 2014-08-08 MED ORDER — ONDANSETRON HCL 4 MG/2ML IJ SOLN
INTRAMUSCULAR | Status: AC
  Filled 2014-08-08: qty 2

## 2014-08-08 MED ORDER — MEPERIDINE HCL 100 MG/ML IJ SOLN
INTRAMUSCULAR | Status: DC | PRN
  Administered 2014-08-08: 25 mg via INTRAVENOUS
  Administered 2014-08-08: 50 mg via INTRAVENOUS

## 2014-08-08 MED ORDER — LIDOCAINE VISCOUS 2 % MT SOLN
OROMUCOSAL | Status: DC | PRN
  Administered 2014-08-08: 3 mL via OROMUCOSAL

## 2014-08-08 MED ORDER — ONDANSETRON HCL 4 MG/2ML IJ SOLN
INTRAMUSCULAR | Status: DC | PRN
  Administered 2014-08-08: 4 mg via INTRAVENOUS

## 2014-08-08 MED ORDER — LIDOCAINE VISCOUS 2 % MT SOLN
OROMUCOSAL | Status: AC
  Filled 2014-08-08: qty 15

## 2014-08-08 MED ORDER — STERILE WATER FOR IRRIGATION IR SOLN
Status: DC | PRN
  Administered 2014-08-08: 10:00:00

## 2014-08-08 MED ORDER — SODIUM CHLORIDE 0.9 % IV SOLN
INTRAVENOUS | Status: DC
  Administered 2014-08-08: 10:00:00 via INTRAVENOUS

## 2014-08-08 MED ORDER — MEPERIDINE HCL 100 MG/ML IJ SOLN
INTRAMUSCULAR | Status: AC
  Filled 2014-08-08: qty 2

## 2014-08-08 MED ORDER — MIDAZOLAM HCL 5 MG/5ML IJ SOLN
INTRAMUSCULAR | Status: DC | PRN
  Administered 2014-08-08 (×2): 2 mg via INTRAVENOUS

## 2014-08-08 NOTE — Op Note (Signed)
[facility] [address] [place], [address]   ENDOSCOPY PROCEDURE REPORT  PATIENT: Eric, Guzman  MR#: [id number] BIRTHDATE: 1950-12-04 , 1  yrs. old GENDER: male ENDOSCOPIST: R.  Garfield Cornea, MD FACP FACG REFERRED BY:  Sharilyn Sites, M.D. PROCEDURE DATE:  08-31-2014 PROCEDURE:  EGD, diagnostic INDICATIONS:  History of peptic ulcer disease. MEDICATIONS: Versed 4 mg IV and Demerol 75 mg IV in divided doses. Xylocaine gel orally.  Zofran 4 mg IV. ASA CLASS:      Class II  CONSENT: The risks, benefits, limitations, alternatives and imponderables have been discussed.  The potential for biopsy, esophogeal dilation, etc. have also been reviewed.  Questions have been answered.  All parties agreeable.  Please see the history and physical in the medical record for more information.  DESCRIPTION OF PROCEDURE: After the risks benefits and alternatives of the procedure were thoroughly explained, informed consent was obtained.  The EG-[id number] ([id number]) endoscope was introduced through the mouth and advanced to the second portion of the duodenum , limited by Without limitations. The instrument was slowly withdrawn as the mucosa was fully examined.    Normal esophagus.  Stomach empty.  Normal-appearing gastric mucosa. Previously noted gastric ulcers completely healed.  Patent pylorus. Examination of the bulb and second portion revealed no mucosal abnormalities.  Retroflexed views revealed no abnormalities. The scope was then withdrawn from the patient and the procedure completed.  COMPLICATIONS: There were no immediate complications.  ENDOSCOPIC IMPRESSION: Normal EGD. Previously noted gastric ulcers completely healed.   RECOMMENDATIONS: Need to recent CBC.  Patient may decrease Protonix to 40 mg once daily.  REPEAT EXAM:  eSigned:  R. Garfield Cornea, MD Rosalita Chessman [facility] 31-Aug-2014 10:35 AM    CC:  CPT CODES: ICD CODES:  The ICD and CPT codes recommended by  this software are interpretations from the data that the clinical staff has captured with the software.  The verification of the translation of this report to the ICD and CPT codes and modifiers is the sole responsibility of the health care institution and practicing physician where this report was generated.  [facility]. will not be held responsible for the validity of the ICD and CPT codes included on this report.  AMA assumes no liability for data contained or not contained herein. CPT is a Designer, television/film set of the [employer].  PATIENT NAME:  Eric, Guzman MR#: [id number]

## 2014-08-08 NOTE — H&P (View-Only) (Signed)
    Referring Provider: Golding, John, MD Primary Care Physician:  GOLDING, JOHN CABOT, MD Primary GI: Dr. Rourk   Chief Complaint  Patient presents with  . Follow-up    HPI:   Eric Guzman is a 64 y.o. male presenting today with a history normocytic anemia, normal ferritin and low normal iron at 41. Due to dropping Hgb from 14 in Feb 2015 last year to 11.7 in Aug 2015, he underwent EGD/TCS. PUD  noted on EGD in Oct 2015. Negative H.pylori. Presents today to set up surveillance EGD to document ulcer healing. Only belches with exercise. Otherwise no reflux symptoms. No constipation or diarrhea. Protonix BID. Colonoscopy up-to-date as of Oct 2015. Next screening 2020 due to family history of colon cancer.   Past Medical History  Diagnosis Date  . Diabetes   . Hypercholesteremia   . Anemia     Past Surgical History  Procedure Laterality Date  . Colonoscopy  12/15/2004    RMR:.  Anal papilla.  Otherwise, normal rectum and colon  . Colonoscopy  Oct 2011    Dr. Rourk: normal  . Colonoscopy N/A 04/11/2014    Dr. Rourk: Anal papilla and internal hemorroids; otherwise normal ileocolonoscopy  . Esophagogastroduodenoscopy N/A 04/11/2014    Dr. Rourk: erosive reflux esophagitis.  Gastric ulcer- status post biopsy. duodenal erosions-status post biopsy, negative H.pylori    Current Outpatient Prescriptions  Medication Sig Dispense Refill  . atorvastatin (LIPITOR) 20 MG tablet Take 20 mg by mouth daily.    . lisinopril (PRINIVIL,ZESTRIL) 20 MG tablet Take 20 mg by mouth daily.    . Multiple Vitamin (MULTIVITAMIN) tablet Take 1 tablet by mouth daily. WITH IRON    . pantoprazole (PROTONIX) 40 MG tablet Take 40 mg by mouth 2 (two) times daily.    . pioglitazone-metformin (ACTOPLUS MET) 15-850 MG per tablet Take 1 tablet by mouth 3 (three) times daily.    . sitaGLIPtin (JANUVIA) 100 MG tablet Take 100 mg by mouth daily.     No current facility-administered medications for this visit.     Allergies as of 07/17/2014  . (No Known Allergies)    Family History  Problem Relation Age of Onset  . Colon cancer Father     diagnosed in his 70s, deceased in his 80s    History   Social History  . Marital Status: Widowed    Spouse Name: N/A    Number of Children: N/A  . 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 Smoking status: Never Smoker   . Smokeless tobacco: None  . Alcohol Use: No  . Drug Use: No  . Sexual Activity: None   Other Topics Concern  . None   Social History Narrative    Review of Systems: As mentioned in HPI.   Physical Exam: BP 124/71 mmHg  Pulse 96  Temp(Src) 97.4 F (36.3 C) (Oral)  Ht 5' 6" (1.676 m)  Wt 157 lb 12.8 oz (71.578 kg)  BMI 25.48 kg/m2 General:   Alert and oriented. No distress noted. Pleasant and cooperative.  Head:  Normocephalic and atraumatic. Eyes:  Conjuctiva clear without scleral icterus. Mouth:  Oral mucosa pink and moist. Good dentition. No lesions. Heart:  S1, S2 present without murmurs, rubs, or gallops. Regular rate and rhythm. Abdomen:  +BS, soft, non-tender and non-distended. No rebound or guarding. No HSM or masses noted. Msk:  Symmetrical without gross   deformities. Normal posture. Extremities:  Without edema. Neurologic:  Alert and  oriented x4;  grossly normal neurologically. Skin:  Intact without significant lesions or rashes. Psych:  Alert and cooperative. Normal mood and affect.  Lab Results  Component Value Date   HGB 11.7 02/05/2014   HCT 35 02/05/2014   Lab Results  Component Value Date   IRON 41 02/05/2014   TIBC 332 02/05/2014    

## 2014-08-08 NOTE — Discharge Instructions (Signed)
EGD Discharge instructions Please read the instructions outlined below and refer to this sheet in the next few weeks. These discharge instructions provide you with general information on caring for yourself after you leave the hospital. Your doctor may also give you specific instructions. While your treatment has been planned according to the most current medical practices available, unavoidable complications occasionally occur. If you have any problems or questions after discharge, please call your doctor. ACTIVITY  You may resume your regular activity but move at a slower pace for the next 24 hours.   Take frequent rest periods for the next 24 hours.   Walking will help expel (get rid of) the air and reduce the bloated feeling in your abdomen.   No driving for 24 hours (because of the anesthesia (medicine) used during the test).   You may shower.   Do not sign any important legal documents or operate any machinery for 24 hours (because of the anesthesia used during the test).  NUTRITION  Drink plenty of fluids.   You may resume your normal diet.   Begin with a light meal and progress to your normal diet.   Avoid alcoholic beverages for 24 hours or as instructed by your caregiver.  MEDICATIONS  You may resume your normal medications unless your caregiver tells you otherwise.  WHAT YOU CAN EXPECT TODAY  You may experience abdominal discomfort such as a feeling of fullness or gas pains.  FOLLOW-UP  Your doctor will discuss the results of your test with you.  SEEK IMMEDIATE MEDICAL ATTENTION IF ANY OF THE FOLLOWING OCCUR:  Excessive nausea (feeling sick to your stomach) and/or vomiting.   Severe abdominal pain and distention (swelling).   Trouble swallowing.   Temperature over 101 F (37.8 C).   Rectal bleeding or vomiting of blood.    May decrease Protonix to 40 mg once daily  Avoid nonsteroidals like ibuprofen and Aleve in the future.  We will check on your  hemoglobin and other labs done at Dr. Cornelia Copa recently

## 2014-08-08 NOTE — Interval H&P Note (Signed)
History and Physical Interval Note:  08/08/2014 10:11 AM  Eric Guzman  has presented today for surgery, with the diagnosis of peptic ulcer disease  The various methods of treatment have been discussed with the patient and family. After consideration of risks, benefits and other options for treatment, the patient has consented to  Procedure(s) with comments: ESOPHAGOGASTRODUODENOSCOPY (EGD) (N/A) - 1000am as a surgical intervention .  The patient's history has been reviewed, patient examined, no change in status, stable for surgery.  I have reviewed the patient's chart and labs.  Questions were answered to the patient's satisfaction.     Haylyn Halberg    No change. Surveillance EGD per plan.The risks, benefits, limitations, alternatives and imponderables have been reviewed with the patient. Potential for esophageal dilation, biopsy, etc. have also been reviewed.  Questions have been answered. All parties agreeable.

## 2014-08-08 NOTE — Telephone Encounter (Signed)
Tried to call pt home and cell numbers- NA and could not leave a message.

## 2014-08-09 ENCOUNTER — Encounter (HOSPITAL_COMMUNITY): Payer: Self-pay | Admitting: Internal Medicine

## 2014-08-13 NOTE — Telephone Encounter (Signed)
Mailed letter and another copy of the blood work orders to the pt.

## 2014-08-27 NOTE — Progress Notes (Signed)
Outside labs from Feb 2016: Hgb 12.7, iron 82, ferritin normal at 45. Improved Hgb. Iron normal, ferritin improved.

## 2014-09-07 ENCOUNTER — Encounter: Payer: Self-pay | Admitting: Gastroenterology

## 2014-10-12 ENCOUNTER — Other Ambulatory Visit: Payer: Self-pay | Admitting: Internal Medicine

## 2015-07-04 DIAGNOSIS — Z6824 Body mass index (BMI) 24.0-24.9, adult: Secondary | ICD-10-CM | POA: Diagnosis not present

## 2015-07-04 DIAGNOSIS — E782 Mixed hyperlipidemia: Secondary | ICD-10-CM | POA: Diagnosis not present

## 2015-07-04 DIAGNOSIS — I10 Essential (primary) hypertension: Secondary | ICD-10-CM | POA: Diagnosis not present

## 2015-07-04 DIAGNOSIS — Z1389 Encounter for screening for other disorder: Secondary | ICD-10-CM | POA: Diagnosis not present

## 2015-07-04 DIAGNOSIS — E119 Type 2 diabetes mellitus without complications: Secondary | ICD-10-CM | POA: Diagnosis not present

## 2016-01-02 DIAGNOSIS — Z1389 Encounter for screening for other disorder: Secondary | ICD-10-CM | POA: Diagnosis not present

## 2016-01-02 DIAGNOSIS — Z0001 Encounter for general adult medical examination with abnormal findings: Secondary | ICD-10-CM | POA: Diagnosis not present

## 2016-01-02 DIAGNOSIS — E119 Type 2 diabetes mellitus without complications: Secondary | ICD-10-CM | POA: Diagnosis not present

## 2016-01-02 DIAGNOSIS — E782 Mixed hyperlipidemia: Secondary | ICD-10-CM | POA: Diagnosis not present

## 2016-01-02 DIAGNOSIS — Z8042 Family history of malignant neoplasm of prostate: Secondary | ICD-10-CM | POA: Diagnosis not present

## 2016-01-02 DIAGNOSIS — Z6823 Body mass index (BMI) 23.0-23.9, adult: Secondary | ICD-10-CM | POA: Diagnosis not present

## 2016-01-02 DIAGNOSIS — I10 Essential (primary) hypertension: Secondary | ICD-10-CM | POA: Diagnosis not present

## 2016-01-02 DIAGNOSIS — R972 Elevated prostate specific antigen [PSA]: Secondary | ICD-10-CM | POA: Diagnosis not present

## 2016-01-06 DIAGNOSIS — N289 Disorder of kidney and ureter, unspecified: Secondary | ICD-10-CM | POA: Diagnosis not present

## 2016-01-06 DIAGNOSIS — Z6823 Body mass index (BMI) 23.0-23.9, adult: Secondary | ICD-10-CM | POA: Diagnosis not present

## 2016-01-07 ENCOUNTER — Other Ambulatory Visit: Payer: Self-pay | Admitting: Gastroenterology

## 2016-02-25 DIAGNOSIS — H5203 Hypermetropia, bilateral: Secondary | ICD-10-CM | POA: Diagnosis not present

## 2016-02-25 DIAGNOSIS — H524 Presbyopia: Secondary | ICD-10-CM | POA: Diagnosis not present

## 2016-02-25 DIAGNOSIS — E1165 Type 2 diabetes mellitus with hyperglycemia: Secondary | ICD-10-CM | POA: Diagnosis not present

## 2016-02-25 DIAGNOSIS — E113293 Type 2 diabetes mellitus with mild nonproliferative diabetic retinopathy without macular edema, bilateral: Secondary | ICD-10-CM | POA: Diagnosis not present

## 2016-02-25 DIAGNOSIS — H52223 Regular astigmatism, bilateral: Secondary | ICD-10-CM | POA: Diagnosis not present

## 2016-03-09 ENCOUNTER — Encounter (INDEPENDENT_AMBULATORY_CARE_PROVIDER_SITE_OTHER): Payer: Medicare HMO | Admitting: Ophthalmology

## 2016-03-09 DIAGNOSIS — H2513 Age-related nuclear cataract, bilateral: Secondary | ICD-10-CM

## 2016-03-09 DIAGNOSIS — D3132 Benign neoplasm of left choroid: Secondary | ICD-10-CM | POA: Diagnosis not present

## 2016-03-09 DIAGNOSIS — H43813 Vitreous degeneration, bilateral: Secondary | ICD-10-CM | POA: Diagnosis not present

## 2016-03-09 DIAGNOSIS — E113292 Type 2 diabetes mellitus with mild nonproliferative diabetic retinopathy without macular edema, left eye: Secondary | ICD-10-CM

## 2016-03-09 DIAGNOSIS — I10 Essential (primary) hypertension: Secondary | ICD-10-CM | POA: Diagnosis not present

## 2016-03-09 DIAGNOSIS — E11311 Type 2 diabetes mellitus with unspecified diabetic retinopathy with macular edema: Secondary | ICD-10-CM

## 2016-03-09 DIAGNOSIS — H35033 Hypertensive retinopathy, bilateral: Secondary | ICD-10-CM

## 2016-03-09 DIAGNOSIS — E113311 Type 2 diabetes mellitus with moderate nonproliferative diabetic retinopathy with macular edema, right eye: Secondary | ICD-10-CM | POA: Diagnosis not present

## 2016-04-06 LAB — LIPID PANEL
Cholesterol: 134 mg/dL (ref 0–200)
HDL: 50 mg/dL (ref 35–70)
LDL CALC: 70 mg/dL
Triglycerides: 70 mg/dL (ref 40–160)

## 2016-04-06 LAB — HEMOGLOBIN A1C: HEMOGLOBIN A1C: 11

## 2016-04-16 DIAGNOSIS — E1165 Type 2 diabetes mellitus with hyperglycemia: Secondary | ICD-10-CM | POA: Diagnosis not present

## 2016-04-16 DIAGNOSIS — Z6824 Body mass index (BMI) 24.0-24.9, adult: Secondary | ICD-10-CM | POA: Diagnosis not present

## 2016-04-16 DIAGNOSIS — Z1389 Encounter for screening for other disorder: Secondary | ICD-10-CM | POA: Diagnosis not present

## 2016-05-21 ENCOUNTER — Encounter: Payer: Self-pay | Admitting: "Endocrinology

## 2016-05-21 ENCOUNTER — Ambulatory Visit (INDEPENDENT_AMBULATORY_CARE_PROVIDER_SITE_OTHER): Payer: Medicare HMO | Admitting: "Endocrinology

## 2016-05-21 VITALS — BP 144/85 | HR 90 | Ht 66.0 in | Wt 149.0 lb

## 2016-05-21 DIAGNOSIS — N183 Chronic kidney disease, stage 3 (moderate): Secondary | ICD-10-CM | POA: Diagnosis not present

## 2016-05-21 DIAGNOSIS — I10 Essential (primary) hypertension: Secondary | ICD-10-CM | POA: Diagnosis not present

## 2016-05-21 DIAGNOSIS — E1165 Type 2 diabetes mellitus with hyperglycemia: Secondary | ICD-10-CM

## 2016-05-21 DIAGNOSIS — E7800 Pure hypercholesterolemia, unspecified: Secondary | ICD-10-CM | POA: Diagnosis not present

## 2016-05-21 DIAGNOSIS — IMO0002 Reserved for concepts with insufficient information to code with codable children: Secondary | ICD-10-CM | POA: Insufficient documentation

## 2016-05-21 DIAGNOSIS — E1122 Type 2 diabetes mellitus with diabetic chronic kidney disease: Secondary | ICD-10-CM | POA: Diagnosis not present

## 2016-05-21 MED ORDER — METFORMIN HCL 500 MG PO TABS: 500.0000 mg | ORAL_TABLET | Freq: Two times a day (BID) | ORAL | 3 refills | Status: DC

## 2016-05-21 MED ORDER — LISINOPRIL 20 MG PO TABS: 20.0000 mg | ORAL_TABLET | Freq: Every day | ORAL | 2 refills | Status: DC

## 2016-05-21 NOTE — Progress Notes (Signed)
Subjective:    Patient ID: Eric Guzman, male    DOB: 1950-07-29. Patient is being seen in consultation for management of diabetes requested by  Purvis Kilts, MD  Past Medical History:  Diagnosis Date  . Anemia   . Diabetes ([place])   . Hypercholesteremia    Past Surgical History:  Procedure Laterality Date  . COLONOSCOPY  12/15/2004   RMR:.  Anal papilla.  Otherwise, normal rectum and colon  . COLONOSCOPY  Oct 2011   Dr. Gala Romney: normal  . COLONOSCOPY N/A 04/11/2014   Dr. Gala Romney: Anal papilla and internal hemorroids; otherwise normal ileocolonoscopy  . ESOPHAGOGASTRODUODENOSCOPY N/A 04/11/2014   Dr. Gala Romney: erosive reflux esophagitis.  Gastric ulcer- status post biopsy. duodenal erosions-status post biopsy, negative H.pylori  . ESOPHAGOGASTRODUODENOSCOPY N/A 08/08/2014   Procedure: ESOPHAGOGASTRODUODENOSCOPY (EGD);  Surgeon: Daneil Dolin, MD;  Location: [facility];  Service: Endoscopy;  Laterality: N/A;  1000am   Social History   Social History  . Marital status: Widowed    Spouse name: N/A  . Number of children: N/A  . Years of education: N/A   Occupational History  . retired     Dealer   Social History Main Topics  . Smoking status: Never Smoker  . Smokeless tobacco: Never Used  . Alcohol use No  . Drug use: No  . Sexual activity: Not on file   Other Topics Concern  . Not on file   Social History Narrative  . No narrative on file   Outpatient Encounter Prescriptions as of 05/21/2016  Medication Sig  . atorvastatin (LIPITOR) 20 MG tablet Take 20 mg by mouth daily.  Marland Kitchen lisinopril (PRINIVIL,ZESTRIL) 20 MG tablet Take 1 tablet (20 mg total) by mouth daily.  . Multiple Vitamin (MULTIVITAMIN) tablet Take 1 tablet by mouth daily. WITH IRON  . pantoprazole (PROTONIX) 40 MG tablet Take 1 tablet (40 mg total) by mouth daily.  . sitaGLIPtin (JANUVIA) 100 MG tablet Take 100 mg by mouth daily.  . [DISCONTINUED] glimepiride (AMARYL) 4 MG tablet Take 2 mg by  mouth 2 (two) times daily.  . [DISCONTINUED] lisinopril (PRINIVIL,ZESTRIL) 20 MG tablet Take 20 mg by mouth daily.  . metFORMIN (GLUCOPHAGE) 500 MG tablet Take 1 tablet (500 mg total) by mouth 2 (two) times daily with a meal.  . [DISCONTINUED] pioglitazone-metformin (ACTOPLUS MET) 15-850 MG per tablet Take 1 tablet by mouth 3 (three) times daily.   No facility-administered encounter medications on file as of 05/21/2016.    ALLERGIES: No Known Allergies VACCINATION STATUS:  There is no immunization history on file for this patient.  Diabetes  He presents for his initial diabetic visit. He has type 2 diabetes mellitus. Onset time: He was diagnosed at approximate age of 3 years. His disease course has been worsening. There are no hypoglycemic associated symptoms. Pertinent negatives for hypoglycemia include no confusion, headaches, pallor or seizures. There are no diabetic associated symptoms. Pertinent negatives for diabetes include no chest pain, no fatigue, no polydipsia, no polyphagia, no polyuria and no weakness. There are no hypoglycemic complications. Symptoms are stable. Diabetic complications include nephropathy and retinopathy. Risk factors for coronary artery disease include diabetes mellitus, dyslipidemia, hypertension, male sex and tobacco exposure. Current diabetic treatment includes oral agent (dual therapy) (He is taking Januvia 100 mg by mouth twice a day and glimepiride 4 mg by mouth twice a day). His weight is stable. He is following a generally unhealthy diet. When asked about meal planning, he reported none. He  has not had a previous visit with a dietitian. He participates in exercise three times a week. Home blood sugar record trend: She brought her log book with him showing recent control of glycemia to near target. His overall blood glucose range is 130-140 mg/dl. An ACE inhibitor/angiotensin II receptor blocker is not being taken. Eye exam is current (Patient has history of  retinopathy status post laser therapy.).  Hyperlipidemia  This is a chronic problem. The current episode started more than 1 year ago. The problem is uncontrolled. Recent lipid tests were reviewed and are variable. Exacerbating diseases include diabetes. Pertinent negatives include no chest pain, myalgias or shortness of breath. Current antihyperlipidemic treatment includes statins. Risk factors for coronary artery disease include dyslipidemia, diabetes mellitus, hypertension, male sex and a sedentary lifestyle.  Hypertension  This is a chronic problem. The current episode started more than 1 year ago. The problem is uncontrolled. Pertinent negatives include no chest pain, headaches, neck pain, palpitations or shortness of breath. Risk factors for coronary artery disease include family history, diabetes mellitus, sedentary lifestyle and smoking/tobacco exposure. Past treatments include nothing. Hypertensive end-organ damage includes kidney disease and retinopathy.       Review of Systems  Constitutional: Negative for chills, fatigue, fever and unexpected weight change.  HENT: Negative for dental problem, mouth sores and trouble swallowing.   Eyes: Negative for visual disturbance.  Respiratory: Negative for cough, choking, chest tightness, shortness of breath and wheezing.   Cardiovascular: Negative for chest pain, palpitations and leg swelling.  Gastrointestinal: Negative for abdominal distention, abdominal pain, constipation, diarrhea, nausea and vomiting.  Endocrine: Negative for polydipsia, polyphagia and polyuria.  Genitourinary: Negative for dysuria, flank pain, hematuria and urgency.  Musculoskeletal: Negative for back pain, gait problem, myalgias and neck pain.  Skin: Negative for pallor, rash and wound.  Neurological: Negative for seizures, syncope, weakness, numbness and headaches.  Psychiatric/Behavioral: Negative.  Negative for confusion and dysphoric mood.    Objective:    BP  (!) 144/85   Pulse 90   Ht '5\' 6"'$  (1.676 m)   Wt 149 lb (67.6 kg)   BMI 24.05 kg/m   Wt Readings from Last 3 Encounters:  05/21/16 149 lb (67.6 kg)  07/17/14 157 lb 12.8 oz (71.6 kg)  04/11/14 163 lb (73.9 kg)    Physical Exam  Constitutional: He is oriented to person, place, and time. He appears well-developed. He is cooperative. No distress.  HENT:  Head: Normocephalic and atraumatic.  Eyes: EOM are normal.  Neck: Normal range of motion. Neck supple. No tracheal deviation present. No thyromegaly present.  Cardiovascular: Normal rate, S1 normal, S2 normal and normal heart sounds.  Exam reveals no gallop.   No murmur heard. Pulses:      Dorsalis pedis pulses are 1+ on the right side, and 1+ on the left side.       Posterior tibial pulses are 1+ on the right side, and 1+ on the left side.  Pulmonary/Chest: Breath sounds normal. No respiratory distress. He has no wheezes.  Abdominal: Soft. Bowel sounds are normal. He exhibits no distension. There is no tenderness. There is no guarding and no CVA tenderness.  Musculoskeletal: He exhibits no edema.       Right shoulder: He exhibits no swelling and no deformity.  Neurological: He is alert and oriented to person, place, and time. He has normal strength and normal reflexes. No cranial nerve deficit or sensory deficit. Gait normal.  Skin: Skin is warm and dry. No rash  noted. No cyanosis. Nails show no clubbing.  Psychiatric: He has a normal mood and affect. His speech is normal and behavior is normal. Judgment and thought content normal. Cognition and memory are normal.     Recent Results (from the past 2160 hour(s))  Lipid panel     Status: None   Collection Time: 04/06/16 12:00 AM  Result Value Ref Range   Triglycerides 70 40 - 160 mg/dL   Cholesterol 134 0 - 200 mg/dL   HDL 50 35 - 70 mg/dL   LDL Cholesterol 70 mg/dL  Hemoglobin A1c     Status: None   Collection Time: 04/06/16 12:00 AM  Result Value Ref Range   Hemoglobin A1C 11          Assessment & Plan:   1. Uncontrolled type 2 diabetes mellitus with stage 3 chronic kidney disease, without long-term current use of insulin ([place])  - Patient has currently uncontrolled symptomatic type 2 DM since  65 years of age,  with most recent A1c of 11 %.  - Patient's recent blood glucose profile on his log book is much better and near target. Recent labs reviewed, showing CKD stage 3A.   His diabetes is complicated by retinopathy, nephropathy,   and patient remains at a high risk for more acute and chronic complications of diabetes which include CAD, CVA, CKD, retinopathy, and neuropathy. These are all discussed in detail with the patient.  - I have counseled the patient on diet management , by adopting a carbohydrate restricted/protein rich diet.  - Suggestion is made for patient to avoid simple carbohydrates   from their diet including Cakes , Desserts, Ice Cream,  Soda (  diet and regular) , Sweet Tea , Candies,  Chips, Cookies, Artificial Sweeteners,   and "Sugar-free" Products . This will help patient to have stable blood glucose profile and potentially avoid unintended weight gain.  - I encouraged the patient to switch to  unprocessed or minimally processed complex starch and increased protein intake (animal or plant source), fruits, and vegetables.  - Patient is advised to stick to a routine mealtimes to eat 3 meals  a day and avoid unnecessary snacks ( to snack only to correct hypoglycemia).  - The patient will be scheduled with Jearld Fenton, RDN, CDE for individualized DM education.  - I have approached patient with the following individualized plan to manage diabetes and patient agrees:   - Per his report he has never been overweight or obese, denies any history of pancreatitis nor heavy alcohol intake. - His presentation is not typical type II nor type 1 diabetes. He denies any history of diabetic ketoacidosis. - Despite his high A1c of 11%, he declines an offer  of insulin treatment. - I  will proceed to  make modifications on his medications, to maximize his oral medication options.  - I will continue Januvia 100 mg by mouth daily , therapeutically suitable for patient. - I will discontinue glimepiride , risk outweighs benefit for this patient. - He can still benefit from low-dose metformin with GFR of 53, I will initiate metformin 500 mg by mouth twice a day. I have discussed side effects and precautions with him.  - If his A1c approaches target near 7%, he will have anti-pancreatic studies classify his diabetes properly. - If his glycemic profile/ A1c does not improve significantly by next visit, he will be reapproached for insulin therapy. - Patient is not suitable candidate for Victoza, Byetta, nor other injectable incretin therapy. -  patient specific targets for  A1c;  LDL, HDL, Triglycerides, and  Waist Circumference were discussed in detail.  2) BP/HTN: Uncontrolled, and for unclear reasons he was not taking his lisinopril which shows in his oral records. I have advised him to resume lisinopril 20 mg by mouth daily.  3) Lipids/HPL:  Controlled, LDL at 70, and advised him to continue to be poor 20 mg by mouth daily at bedtime. 4)  Weight/Diet:   his weight is appropriate with BMI of 25.4 , he would benefit CDE Consult, exercise, and detailed carbohydrates information provided.  5) Chronic Care/Health Maintenance:  -Patient is on ACEI/ARB and Statin medications and encouraged to continue to follow up with Ophthalmology, Podiatrist at least yearly or according to recommendations, and advised to  stay away from smoking. I have recommended yearly flu vaccine and pneumonia vaccination at least every 5 years; moderate intensity exercise for up to 150 minutes weekly; and  sleep for at least 7 hours a day.  - 60 minutes of time was spent on the care of this patient , 50% of which was applied for counseling on diabetes complications and their  preventions.  - Patient to bring meter and  blood glucose logs during their next visit.   - I advised patient to maintain close follow up with Purvis Kilts, MD for primary care needs.  Follow up plan: - Return in about 9 weeks (around 07/23/2016) for meter, and logs.  Glade Lloyd, MD Phone: [phone]  Fax: [phone]   05/21/2016, 1:14 PM

## 2016-05-22 ENCOUNTER — Telehealth: Payer: Self-pay

## 2016-05-22 NOTE — Telephone Encounter (Signed)
Pharmacy called for medication clarification. Notified them that MTF is 500 mg bid.

## 2016-05-28 ENCOUNTER — Ambulatory Visit: Payer: Medicare HMO | Admitting: "Endocrinology

## 2016-06-04 ENCOUNTER — Encounter: Payer: Medicare HMO | Attending: "Endocrinology | Admitting: Nutrition

## 2016-06-04 VITALS — Ht 66.0 in | Wt 150.0 lb

## 2016-06-04 DIAGNOSIS — E1165 Type 2 diabetes mellitus with hyperglycemia: Secondary | ICD-10-CM | POA: Diagnosis not present

## 2016-06-04 DIAGNOSIS — Z713 Dietary counseling and surveillance: Secondary | ICD-10-CM | POA: Diagnosis not present

## 2016-06-04 DIAGNOSIS — N183 Chronic kidney disease, stage 3 unspecified: Secondary | ICD-10-CM

## 2016-06-04 DIAGNOSIS — E1122 Type 2 diabetes mellitus with diabetic chronic kidney disease: Secondary | ICD-10-CM | POA: Diagnosis not present

## 2016-06-04 NOTE — Patient Instructions (Signed)
Goals 1. Follow MY Plate  2. Keep foods less than 200 mg of sodium per serving 3. Eat 3-4 carb choices per meal 4. Keep drinking water 5. Keep walking 3-5 miles most days of week 6. Keep A1C to 7% or less Avoid processed foods

## 2016-06-04 NOTE — Progress Notes (Signed)
  Medical Nutrition Therapy:  Appt start time: 0800 end time:  0900.  Assessment:  Primary concerns today: Diabetes Type 2. LIves by himself. He is a widow. Eats often with his daughter. Use to walk 3-5 miles a day but hasn't lately due to cold weather.  A1C was 6% about 2 months ago and then it jumped up to 11% when he was on  Actos/Metformin.  On low dose Metformin 500 mg BID now and tolerating well.  Januvia 100 mg per day..  Sees Dr. Dorris Fetch  Testing BID. FBS;90-130 mg/dl. Before supper 130-140's.  Has cut out ice cream and snacks between meals. Follows low salt diet mostly. Walks a lot for physical activity.   Diet is fairly consistent and within his carb allowance.   Preferred Learning Style:   No preference indicated    Learning Readiness:    Ready  Change in progress   MEDICATIONS:    DIETARY INTAKE:  24-hr recall:  B ( AM): 1 egg, 2 slices toast, jelly, water  Snk ( AM): none L ( PM): Chicken sandwich on wheat bread, toss salad and water Snk ( PM):  D ( PM): Toss salad and grilled cheese sandwich, water Snk ( PM): none Beverages: water  Usual physical activity: 3-5 miles most days of the week.  Estimated energy needs: 1800-2000 calories 200 g carbohydrates 135 g protein 50 g fat  Progress Towards Goal(s):  In progress.   Nutritional Diagnosis:  NB-1.1 Food and nutrition-related knowledge deficit As related to Diabetes.  As evidenced by A1C 11%.    Intervention:  Nutrition and Diabetes education provided on My Plate, CHO counting, meal planning, portion sizes, timing of meals, avoiding snacks between meals unless having a low blood sugar, target ranges for A1C and blood sugars, signs/symptoms and treatment of hyper/hypoglycemia, monitoring blood sugars, taking medications as prescribed, benefits of exercising 30 minutes per day and prevention of complications of DM.   Goals 1. Follow MY Plate  2. Keep foods less than 200 mg of sodium per serving 3. Eat 3-4 carb  choices per meal 4. Keep drinking water 5. Keep walking 3-5 miles most days of week 6. Keep A1C to 7% or less Avoid processed foods   Teaching Method Utilized:   Visual Auditory Hands on  Handouts given during visit include:  The Plate Method  Meal Plan Card   Diabetes Instructions.   Barriers to learning/adherence to lifestyle change: none  Demonstrated degree of understanding via:  Teach Back   Monitoring/Evaluation:  Dietary intake, exercise, meal planning, SBG, and body weight in 2 month(s).

## 2016-07-09 ENCOUNTER — Ambulatory Visit (INDEPENDENT_AMBULATORY_CARE_PROVIDER_SITE_OTHER): Payer: Medicare HMO | Admitting: Ophthalmology

## 2016-07-17 ENCOUNTER — Other Ambulatory Visit: Payer: Self-pay | Admitting: "Endocrinology

## 2016-07-17 DIAGNOSIS — N183 Chronic kidney disease, stage 3 (moderate): Secondary | ICD-10-CM | POA: Diagnosis not present

## 2016-07-17 DIAGNOSIS — E1122 Type 2 diabetes mellitus with diabetic chronic kidney disease: Secondary | ICD-10-CM | POA: Diagnosis not present

## 2016-07-17 DIAGNOSIS — E1165 Type 2 diabetes mellitus with hyperglycemia: Secondary | ICD-10-CM | POA: Diagnosis not present

## 2016-07-18 LAB — COMPREHENSIVE METABOLIC PANEL
ALT: 18 U/L (ref 9–46)
AST: 20 U/L (ref 10–35)
Albumin: 4.3 g/dL (ref 3.6–5.1)
Alkaline Phosphatase: 57 U/L (ref 40–115)
BUN: 34 mg/dL — ABNORMAL HIGH (ref 7–25)
CHLORIDE: 106 mmol/L (ref 98–110)
CO2: 23 mmol/L (ref 20–31)
CREATININE: 1.29 mg/dL — AB (ref 0.70–1.25)
Calcium: 9.2 mg/dL (ref 8.6–10.3)
GLUCOSE: 145 mg/dL — AB (ref 65–99)
POTASSIUM: 4.7 mmol/L (ref 3.5–5.3)
SODIUM: 140 mmol/L (ref 135–146)
Total Bilirubin: 0.6 mg/dL (ref 0.2–1.2)
Total Protein: 7.1 g/dL (ref 6.1–8.1)

## 2016-07-18 LAB — HEMOGLOBIN A1C
Hgb A1c MFr Bld: 6.8 % — ABNORMAL HIGH (ref <5.7)
Mean Plasma Glucose: 148 mg/dL

## 2016-07-21 DIAGNOSIS — Z978 Presence of other specified devices: Secondary | ICD-10-CM

## 2016-07-21 HISTORY — DX: Presence of other specified devices: Z97.8

## 2016-07-23 ENCOUNTER — Encounter: Payer: Self-pay | Admitting: "Endocrinology

## 2016-07-23 ENCOUNTER — Ambulatory Visit (INDEPENDENT_AMBULATORY_CARE_PROVIDER_SITE_OTHER): Payer: Medicare HMO | Admitting: "Endocrinology

## 2016-07-23 ENCOUNTER — Encounter: Payer: Medicare HMO | Attending: "Endocrinology | Admitting: Nutrition

## 2016-07-23 ENCOUNTER — Encounter: Payer: Self-pay | Admitting: Nutrition

## 2016-07-23 VITALS — BP 133/85 | HR 76 | Ht 66.0 in | Wt 145.0 lb

## 2016-07-23 VITALS — Wt 145.0 lb

## 2016-07-23 DIAGNOSIS — E1165 Type 2 diabetes mellitus with hyperglycemia: Secondary | ICD-10-CM | POA: Diagnosis not present

## 2016-07-23 DIAGNOSIS — E1122 Type 2 diabetes mellitus with diabetic chronic kidney disease: Secondary | ICD-10-CM | POA: Diagnosis not present

## 2016-07-23 DIAGNOSIS — Z713 Dietary counseling and surveillance: Secondary | ICD-10-CM | POA: Insufficient documentation

## 2016-07-23 DIAGNOSIS — E119 Type 2 diabetes mellitus without complications: Secondary | ICD-10-CM

## 2016-07-23 DIAGNOSIS — N183 Chronic kidney disease, stage 3 (moderate): Secondary | ICD-10-CM | POA: Insufficient documentation

## 2016-07-23 DIAGNOSIS — E7800 Pure hypercholesterolemia, unspecified: Secondary | ICD-10-CM | POA: Diagnosis not present

## 2016-07-23 DIAGNOSIS — I10 Essential (primary) hypertension: Secondary | ICD-10-CM | POA: Diagnosis not present

## 2016-07-23 DIAGNOSIS — IMO0002 Reserved for concepts with insufficient information to code with codable children: Secondary | ICD-10-CM

## 2016-07-23 MED ORDER — SITAGLIPTIN PHOSPHATE 50 MG PO TABS: 50.0000 mg | ORAL_TABLET | Freq: Every day | ORAL | 3 refills | Status: DC

## 2016-07-23 NOTE — Patient Instructions (Signed)
Goals 1. Eat 45-60 grams of carbs per meal 2. Keep drinking 54-84 oz of water per day 3. Continue exercising 3-5 times 3-4 times per week. 4. Keep A1C 6.8% or less

## 2016-07-23 NOTE — Progress Notes (Signed)
Subjective:    Patient ID: Eric Guzman, male    DOB: [birth year]. Patient is being seen in consultation for management of diabetes requested by  Purvis Kilts, MD  Past Medical History:  Diagnosis Date  . Anemia   . Diabetes ([place])   . Hypercholesteremia    Past Surgical History:  Procedure Laterality Date  . COLONOSCOPY  12/15/2004   RMR:.  Anal papilla.  Otherwise, normal rectum and colon  . COLONOSCOPY  Oct 2011   Dr. Gala Romney: normal  . COLONOSCOPY N/A 04/11/2014   Dr. Gala Romney: Anal papilla and internal hemorroids; otherwise normal ileocolonoscopy  . ESOPHAGOGASTRODUODENOSCOPY N/A 04/11/2014   Dr. Gala Romney: erosive reflux esophagitis.  Gastric ulcer- status post biopsy. duodenal erosions-status post biopsy, negative H.pylori  . ESOPHAGOGASTRODUODENOSCOPY N/A 08/08/2014   Procedure: ESOPHAGOGASTRODUODENOSCOPY (EGD);  Surgeon: Daneil Dolin, MD;  Location: [facility];  Service: Endoscopy;  Laterality: N/A;  1000am   Social History   Social History  . Marital status: Widowed    Spouse name: N/A  . Number of children: N/A  . Years of education: N/A   Occupational History  . retired     Dealer   Social History Main Topics  . Smoking status: Never Smoker  . Smokeless tobacco: Never Used  . Alcohol use No  . Drug use: No  . Sexual activity: Not Asked   Other Topics Concern  . None   Social History Narrative  . None   Outpatient Encounter Prescriptions as of 07/23/2016  Medication Sig  . atorvastatin (LIPITOR) 20 MG tablet Take 20 mg by mouth daily.  Marland Kitchen lisinopril (PRINIVIL,ZESTRIL) 20 MG tablet Take 1 tablet (20 mg total) by mouth daily.  . metFORMIN (GLUCOPHAGE) 500 MG tablet Take 1 tablet (500 mg total) by mouth 2 (two) times daily with a meal.  . Multiple Vitamin (MULTIVITAMIN) tablet Take 1 tablet by mouth daily. WITH IRON  . pantoprazole (PROTONIX) 40 MG tablet Take 1 tablet (40 mg total) by mouth daily.  . sitaGLIPtin (JANUVIA) 50 MG tablet Take 1  tablet (50 mg total) by mouth daily.  . [DISCONTINUED] sitaGLIPtin (JANUVIA) 100 MG tablet Take 100 mg by mouth daily.   No facility-administered encounter medications on file as of 07/23/2016.    ALLERGIES: No Known Allergies VACCINATION STATUS:  There is no immunization history on file for this patient.  Diabetes  He presents for his follow-up diabetic visit. He has type 2 diabetes mellitus. Onset time: He was diagnosed at approximate age of 66 years. His disease course has been improving. There are no hypoglycemic associated symptoms. Pertinent negatives for hypoglycemia include no confusion, headaches, pallor or seizures. There are no diabetic associated symptoms. Pertinent negatives for diabetes include no chest pain, no fatigue, no polydipsia, no polyphagia, no polyuria and no weakness. There are no hypoglycemic complications. Symptoms are improving. Diabetic complications include nephropathy and retinopathy. Risk factors for coronary artery disease include diabetes mellitus, dyslipidemia, hypertension, male sex and tobacco exposure. Current diabetic treatment includes oral agent (dual therapy) (He is taking Januvia 100 mg by mouth twice a day and glimepiride 4 mg by mouth twice a day). His weight is stable. He is following a generally unhealthy diet. When asked about meal planning, he reported none. He has not had a previous visit with a dietitian. He participates in exercise three times a week. Home blood sugar record trend: She brought her log book with him showing recent control of glycemia to near target. His overall  blood glucose range is 140-180 mg/dl. An ACE inhibitor/angiotensin II receptor blocker is not being taken. Eye exam is current (Patient has history of retinopathy status post laser therapy.).  Hyperlipidemia  This is a chronic problem. The current episode started more than 1 year ago. The problem is uncontrolled. Recent lipid tests were reviewed and are variable. Exacerbating  diseases include diabetes. Pertinent negatives include no chest pain, myalgias or shortness of breath. Current antihyperlipidemic treatment includes statins. Risk factors for coronary artery disease include dyslipidemia, diabetes mellitus, hypertension, male sex and a sedentary lifestyle.  Hypertension  This is a chronic problem. The current episode started more than 1 year ago. The problem is uncontrolled. Pertinent negatives include no chest pain, headaches, neck pain, palpitations or shortness of breath. Risk factors for coronary artery disease include family history, diabetes mellitus, sedentary lifestyle and smoking/tobacco exposure. Past treatments include nothing. Hypertensive end-organ damage includes kidney disease and retinopathy.     Review of Systems  Constitutional: Negative for chills, fatigue, fever and unexpected weight change.  HENT: Negative for dental problem, mouth sores and trouble swallowing.   Eyes: Negative for visual disturbance.  Respiratory: Negative for cough, choking, chest tightness, shortness of breath and wheezing.   Cardiovascular: Negative for chest pain, palpitations and leg swelling.  Gastrointestinal: Negative for abdominal distention, abdominal pain, constipation, diarrhea, nausea and vomiting.  Endocrine: Negative for polydipsia, polyphagia and polyuria.  Genitourinary: Negative for dysuria, flank pain, hematuria and urgency.  Musculoskeletal: Negative for back pain, gait problem, myalgias and neck pain.  Skin: Negative for pallor, rash and wound.  Neurological: Negative for seizures, syncope, weakness, numbness and headaches.  Psychiatric/Behavioral: Negative.  Negative for confusion and dysphoric mood.    Objective:    BP 133/85   Pulse 76   Ht 5\' 6"  (1.676 m)   Wt 145 lb (65.8 kg)   BMI 23.40 kg/m   Wt Readings from Last 3 Encounters:  07/23/16 145 lb (65.8 kg)  07/23/16 145 lb (65.8 kg)  06/04/16 150 lb (68 kg)    Physical Exam   Constitutional: He is oriented to person, place, and time. He appears well-developed. He is cooperative. No distress.  HENT:  Head: Normocephalic and atraumatic.  Eyes: EOM are normal.  Neck: Normal range of motion. Neck supple. No tracheal deviation present. No thyromegaly present.  Cardiovascular: Normal rate, S1 normal, S2 normal and normal heart sounds.  Exam reveals no gallop.   No murmur heard. Pulses:      Dorsalis pedis pulses are 1+ on the right side, and 1+ on the left side.       Posterior tibial pulses are 1+ on the right side, and 1+ on the left side.  Pulmonary/Chest: Breath sounds normal. No respiratory distress. He has no wheezes.  Abdominal: Soft. Bowel sounds are normal. He exhibits no distension. There is no tenderness. There is no guarding and no CVA tenderness.  Musculoskeletal: He exhibits no edema.       Right shoulder: He exhibits no swelling and no deformity.  Neurological: He is alert and oriented to person, place, and time. He has normal strength and normal reflexes. No cranial nerve deficit or sensory deficit. Gait normal.  Skin: Skin is warm and dry. No rash noted. No cyanosis. Nails show no clubbing.  Psychiatric: He has a normal mood and affect. His speech is normal and behavior is normal. Judgment and thought content normal. Cognition and memory are normal.     Recent Results (from the past 2160  hour(s))  Comprehensive metabolic panel     Status: Abnormal   Collection Time: 07/17/16  8:15 AM  Result Value Ref Range   Sodium 140 135 - 146 mmol/L   Potassium 4.7 3.5 - 5.3 mmol/L   Chloride 106 98 - 110 mmol/L   CO2 23 20 - 31 mmol/L   Glucose, Bld 145 (H) 65 - 99 mg/dL   BUN 34 (H) 7 - 25 mg/dL   Creat 1.29 (H) 0.70 - 1.25 mg/dL    Comment:   For patients > or = 68 years of age: The upper reference limit for Creatinine is approximately 13% higher for people identified as African-American.      Total Bilirubin 0.6 0.2 - 1.2 mg/dL   Alkaline  Phosphatase 57 40 - 115 U/L   AST 20 10 - 35 U/L   ALT 18 9 - 46 U/L   Total Protein 7.1 6.1 - 8.1 g/dL   Albumin 4.3 3.6 - 5.1 g/dL   Calcium 9.2 8.6 - 10.3 mg/dL  Hemoglobin A1c     Status: Abnormal   Collection Time: 07/17/16  8:15 AM  Result Value Ref Range   Hgb A1c MFr Bld 6.8 (H) <5.7 %    Comment:   For someone without known diabetes, a hemoglobin A1c value of 6.5% or greater indicates that they may have diabetes and this should be confirmed with a follow-up test.   For someone with known diabetes, a value <7% indicates that their diabetes is well controlled and a value greater than or equal to 7% indicates suboptimal control. A1c targets should be individualized based on duration of diabetes, age, comorbid conditions, and other considerations.   Currently, no consensus exists for use of hemoglobin A1c for diagnosis of diabetes for children.      Mean Plasma Glucose 148 mg/dL        Assessment & Plan:   1. Uncontrolled type 2 diabetes mellitus with stage 3 chronic kidney disease, without long-term current use of insulin ([place])  - Patient has currently uncontrolled symptomatic type 2 DM since  66 years of age. - Came with improved A1c of 6.8% from 11%. - Patient's recent blood glucose profile on his log book is much better and near target. Recent labs reviewed, showing CKD stage 3A.   His diabetes is complicated by retinopathy, nephropathy,   and patient remains at a high risk for more acute and chronic complications of diabetes which include CAD, CVA, CKD, retinopathy, and neuropathy. These are all discussed in detail with the patient.  - I have counseled the patient on diet management , by adopting a carbohydrate restricted/protein rich diet.  - Suggestion is made for patient to avoid simple carbohydrates   from their diet including Cakes , Desserts, Ice Cream,  Soda (  diet and regular) , Sweet Tea , Candies,  Chips, Cookies, Artificial Sweeteners,   and  "Sugar-free" Products . This will help patient to have stable blood glucose profile and potentially avoid unintended weight gain.  - I encouraged the patient to switch to  unprocessed or minimally processed complex starch and increased protein intake (animal or plant source), fruits, and vegetables.  - Patient is advised to stick to a routine mealtimes to eat 3 meals  a day and avoid unnecessary snacks ( to snack only to correct hypoglycemia).  - The patient will be scheduled with Jearld Fenton, RDN, CDE for individualized DM education.  - I have approached patient with the following individualized plan  to manage diabetes and patient agrees:   - Per his report he has never been overweight or obese, denies any history of pancreatitis nor heavy alcohol intake. - His presentation is not typical type II nor type 1 diabetes. He denies any history of diabetic ketoacidosis.  - I  will proceed to  make modifications on his medications, to maximize his oral medication options.  - I will decrease  Januvia to 50 mg by mouth daily , therapeutically suitable for patient.  - He can still benefit from low-dose metformin with GFR of 53, I will initiate metformin 500 mg by mouth twice a day. I have discussed side effects and precautions with him.  -  he will have anti-pancreatic studies to classify his diabetes properly.  - Patient is not suitable candidate for Victoza, Byetta, nor other injectable incretin therapy. -  patient specific targets for  A1c;  LDL, HDL, Triglycerides, and  Waist Circumference were discussed in detail.  2) BP/HTN: Uncontrolled, and for unclear reasons he was not taking his lisinopril which shows in his oral records. I have advised him to resume lisinopril 20 mg by mouth daily.  3) Lipids/HPL:  Controlled, LDL at 70, and advised him to continue to be poor 20 mg by mouth daily at bedtime. 4)  Weight/Diet:   his weight is appropriate with BMI of 25.4 , he would benefit CDE Consult,  exercise, and detailed carbohydrates information provided.  5) Chronic Care/Health Maintenance:  -Patient is on ACEI/ARB and Statin medications and encouraged to continue to follow up with Ophthalmology, Podiatrist at least yearly or according to recommendations, and advised to  stay away from smoking. I have recommended yearly flu vaccine and pneumonia vaccination at least every 5 years; moderate intensity exercise for up to 150 minutes weekly; and  sleep for at least 7 hours a day.  - 30 minutes of time was spent on the care of this patient , 50% of which was applied for counseling on diabetes complications and their preventions.  - Patient to bring meter and  blood glucose logs during their next visit.   - I advised patient to maintain close follow up with Purvis Kilts, MD for primary care needs.  Follow up plan: - Return in about 3 months (around 10/20/2016) for follow up with pre-visit labs.  Glade Lloyd, MD Phone: [phone]  Fax: [phone]   07/23/2016, 9:05 AM

## 2016-07-23 NOTE — Progress Notes (Signed)
  Medical Nutrition Therapy:  Appt start time: 0800 end time:  0900.  Assessment:  Primary concerns today: Diabetes Type 2. LIves by himself.    No low blood sugars, BS WNL. A1C 6.8%, down from 11%.. Doing great. Eating well balanced meals. Exercising.    Diet is well balanced. Needs 45 grams of carbs per meal instead of only 30 grams to better meet his needs. HE cooks at home, watching portions, reading food labels.  On Metformin 500 mg BID, and Januvia  100 mg/d.Marland Kitchen   Lab Results  Component Value Date   HGBA1C 6.8 (H) 07/17/2016    Preferred Learning Style:   No preference indicated    Learning Readiness:    Ready  Change in progress   MEDICATIONS:    DIETARY INTAKE:  24-hr recall:  B ( AM): 1 egg, 1 slices toast, jelly, water  Snk ( AM): none L ( PM): Chicken sandwich on wheat bread, toss salad and water Snk ( PM):  D ( PM): 1/2 banana sandwich, toss salad and water Snk ( PM): none Beverages: water  Usual physical activity: 3-5 miles most days of the week.  Estimated energy needs: 1800-2000 calories 200 g carbohydrates 135 g protein 50 g fat  Progress Towards Goal(s):  In progress.   Nutritional Diagnosis:  NB-1.1 Food and nutrition-related knowledge deficit As related to Diabetes.  As evidenced by A1C 11%.    Intervention:  Nutrition and Diabetes education provided on My Plate, CHO counting, meal planning, portion sizes, timing of meals, avoiding snacks between meals unless having a low blood sugar, target ranges for A1C and blood sugars, signs/symptoms and treatment of hyper/hypoglycemia, monitoring blood sugars, taking medications as prescribed, benefits of exercising 30 minutes per day and prevention of complications of DM.    Goals 1. Eat 45-60 grams of carbs per meal 2. Keep drinking 54-84 oz of water per day 3. Continue exercising 3-5 times 3-4 times per week. 4. Keep A1C 6.8% or less  Teaching Method Utilized:   Visual Auditory Hands  on  Handouts given during visit include:  The Plate Method  Meal Plan Card   Diabetes Instructions.   Barriers to learning/adherence to lifestyle change: none  Demonstrated degree of understanding via:  Teach Back   Monitoring/Evaluation:  Dietary intake, exercise, meal planning, SBG, and body weight in 3 month(s).

## 2016-07-24 DIAGNOSIS — N342 Other urethritis: Secondary | ICD-10-CM | POA: Diagnosis not present

## 2016-07-24 DIAGNOSIS — Z6824 Body mass index (BMI) 24.0-24.9, adult: Secondary | ICD-10-CM | POA: Diagnosis not present

## 2016-07-27 DIAGNOSIS — E782 Mixed hyperlipidemia: Secondary | ICD-10-CM | POA: Diagnosis not present

## 2016-07-27 DIAGNOSIS — I10 Essential (primary) hypertension: Secondary | ICD-10-CM | POA: Diagnosis not present

## 2016-07-27 DIAGNOSIS — R972 Elevated prostate specific antigen [PSA]: Secondary | ICD-10-CM | POA: Diagnosis not present

## 2016-07-27 DIAGNOSIS — Z1389 Encounter for screening for other disorder: Secondary | ICD-10-CM | POA: Diagnosis not present

## 2016-07-27 DIAGNOSIS — N400 Enlarged prostate without lower urinary tract symptoms: Secondary | ICD-10-CM | POA: Diagnosis not present

## 2016-07-27 DIAGNOSIS — E1165 Type 2 diabetes mellitus with hyperglycemia: Secondary | ICD-10-CM | POA: Diagnosis not present

## 2016-07-27 DIAGNOSIS — Z6824 Body mass index (BMI) 24.0-24.9, adult: Secondary | ICD-10-CM | POA: Diagnosis not present

## 2016-07-28 DIAGNOSIS — E1165 Type 2 diabetes mellitus with hyperglycemia: Secondary | ICD-10-CM | POA: Diagnosis not present

## 2016-07-29 ENCOUNTER — Ambulatory Visit (HOSPITAL_COMMUNITY)
Admission: RE | Admit: 2016-07-29 | Discharge: 2016-07-29 | Disposition: A | Discharge status: Home / Self Care | Payer: Medicare HMO | Source: Ambulatory Visit | Attending: Family Medicine | Admitting: Family Medicine

## 2016-07-29 ENCOUNTER — Other Ambulatory Visit (HOSPITAL_COMMUNITY): Payer: Self-pay | Admitting: Family Medicine

## 2016-07-29 DIAGNOSIS — Z6825 Body mass index (BMI) 25.0-25.9, adult: Secondary | ICD-10-CM | POA: Diagnosis not present

## 2016-07-29 DIAGNOSIS — R972 Elevated prostate specific antigen [PSA]: Secondary | ICD-10-CM | POA: Diagnosis not present

## 2016-07-29 DIAGNOSIS — M7989 Other specified soft tissue disorders: Secondary | ICD-10-CM | POA: Diagnosis not present

## 2016-07-29 DIAGNOSIS — R350 Frequency of micturition: Secondary | ICD-10-CM | POA: Diagnosis not present

## 2016-07-29 DIAGNOSIS — R600 Localized edema: Secondary | ICD-10-CM | POA: Diagnosis not present

## 2016-07-29 DIAGNOSIS — E663 Overweight: Secondary | ICD-10-CM | POA: Diagnosis not present

## 2016-07-29 DIAGNOSIS — Z1389 Encounter for screening for other disorder: Secondary | ICD-10-CM | POA: Diagnosis not present

## 2016-07-30 DIAGNOSIS — N476 Balanoposthitis: Secondary | ICD-10-CM | POA: Diagnosis not present

## 2016-07-30 DIAGNOSIS — R3121 Asymptomatic microscopic hematuria: Secondary | ICD-10-CM | POA: Diagnosis not present

## 2016-07-30 DIAGNOSIS — N138 Other obstructive and reflux uropathy: Secondary | ICD-10-CM | POA: Diagnosis not present

## 2016-07-30 DIAGNOSIS — R338 Other retention of urine: Secondary | ICD-10-CM | POA: Diagnosis not present

## 2016-07-30 DIAGNOSIS — N401 Enlarged prostate with lower urinary tract symptoms: Secondary | ICD-10-CM | POA: Diagnosis not present

## 2016-07-30 DIAGNOSIS — R972 Elevated prostate specific antigen [PSA]: Secondary | ICD-10-CM | POA: Diagnosis not present

## 2016-07-30 DIAGNOSIS — R3129 Other microscopic hematuria: Secondary | ICD-10-CM | POA: Diagnosis not present

## 2016-07-31 DIAGNOSIS — R3914 Feeling of incomplete bladder emptying: Secondary | ICD-10-CM | POA: Diagnosis not present

## 2016-07-31 DIAGNOSIS — R338 Other retention of urine: Secondary | ICD-10-CM | POA: Diagnosis not present

## 2016-08-03 ENCOUNTER — Other Ambulatory Visit: Payer: Self-pay | Admitting: Nurse Practitioner

## 2016-08-05 ENCOUNTER — Ambulatory Visit (INDEPENDENT_AMBULATORY_CARE_PROVIDER_SITE_OTHER): Payer: Medicare HMO | Admitting: Ophthalmology

## 2016-08-07 ENCOUNTER — Ambulatory Visit (INDEPENDENT_AMBULATORY_CARE_PROVIDER_SITE_OTHER): Payer: Medicare HMO | Admitting: Ophthalmology

## 2016-08-07 DIAGNOSIS — E113292 Type 2 diabetes mellitus with mild nonproliferative diabetic retinopathy without macular edema, left eye: Secondary | ICD-10-CM

## 2016-08-07 DIAGNOSIS — E11311 Type 2 diabetes mellitus with unspecified diabetic retinopathy with macular edema: Secondary | ICD-10-CM

## 2016-08-07 DIAGNOSIS — H2513 Age-related nuclear cataract, bilateral: Secondary | ICD-10-CM | POA: Diagnosis not present

## 2016-08-07 DIAGNOSIS — I10 Essential (primary) hypertension: Secondary | ICD-10-CM | POA: Diagnosis not present

## 2016-08-07 DIAGNOSIS — H35033 Hypertensive retinopathy, bilateral: Secondary | ICD-10-CM

## 2016-08-07 DIAGNOSIS — E113311 Type 2 diabetes mellitus with moderate nonproliferative diabetic retinopathy with macular edema, right eye: Secondary | ICD-10-CM

## 2016-08-07 DIAGNOSIS — D3132 Benign neoplasm of left choroid: Secondary | ICD-10-CM | POA: Diagnosis not present

## 2016-08-07 DIAGNOSIS — E119 Type 2 diabetes mellitus without complications: Secondary | ICD-10-CM | POA: Diagnosis not present

## 2016-08-07 DIAGNOSIS — H43813 Vitreous degeneration, bilateral: Secondary | ICD-10-CM | POA: Diagnosis not present

## 2016-08-11 DIAGNOSIS — N401 Enlarged prostate with lower urinary tract symptoms: Secondary | ICD-10-CM | POA: Diagnosis not present

## 2016-08-11 DIAGNOSIS — R972 Elevated prostate specific antigen [PSA]: Secondary | ICD-10-CM | POA: Diagnosis not present

## 2016-08-11 DIAGNOSIS — N476 Balanoposthitis: Secondary | ICD-10-CM | POA: Diagnosis not present

## 2016-08-11 DIAGNOSIS — R3914 Feeling of incomplete bladder emptying: Secondary | ICD-10-CM | POA: Diagnosis not present

## 2016-08-17 ENCOUNTER — Other Ambulatory Visit: Payer: Self-pay | Admitting: Urology

## 2016-08-17 DIAGNOSIS — R972 Elevated prostate specific antigen [PSA]: Secondary | ICD-10-CM

## 2016-08-19 DIAGNOSIS — L2389 Allergic contact dermatitis due to other agents: Secondary | ICD-10-CM | POA: Diagnosis not present

## 2016-08-25 ENCOUNTER — Encounter (HOSPITAL_BASED_OUTPATIENT_CLINIC_OR_DEPARTMENT_OTHER): Payer: Self-pay | Admitting: *Deleted

## 2016-08-25 NOTE — Progress Notes (Signed)
To [facility] at [place] on arrival-Instructed Npo after Mn-will take protonix with small amount water-complete fleet enema prior to arrival.

## 2016-08-31 NOTE — H&P (Signed)
CC: BPH  HPI: Eric Guzman is a 66 year-old male established patient who is here for follow up regarding further evaluation of BPH and lower urinary tract symptoms.    Eric Guzman returns today in f/u for cystoscopy for urinary retention with a failed voiding trial on alpha blockers. He reports a rash on the lower abdomen and penis.      ALLERGIES: Contrast Dye No Allergies    MEDICATIONS: Lisinopril 20 mg tablet  Atorvastatin Calcium 20 mg tablet  Januvia 50 mg tablet 1 tablet PO Daily  Metformin Hcl 500 mg tablet 1 tablet PO Daily  Multivitamin  Nystatin 100,000 unit/gram cream 1 gram Topical BID  Pantoprazole Sodium 40 mg tablet, delayed release  Tamsulosin Hcl 0.4 mg capsule, ext release 24 hr 1 capsule PO Daily     GU PSH: None     PSH Notes: No Surgical Problems   NON-GU PSH: None   GU PMH: Incomplete bladder emptying, He is in retention from BPH. He came in today with clot retention from his Foley catheter. This was easily remedied. - 07/31/2016 Balanoposthitis, He has probable yeast vaginitis. - 07/30/2016 BPH w/LUTS, His prostate is enlarged but benign on exam. - 07/30/2016 Elevated PSA, His elevated PSA is associated with possible prostatitis or just the AUR. I will request his recent UA for further evaluation. - 07/30/2016, Elevated prostate specific antigen (PSA), - 2014, PSA,Elevated, - 2014 Urinary Obstruction, Bilateral, mild following bladder decompression. - 07/30/2016 Urinary Retention, >1024ml - 07/30/2016 BPH w/o LUTS, Benign prostatic hypertrophy without lower urinary tract symptoms - 2014 ED, arterial insufficiency, Erectile dysfunction due to arterial insufficiency - 2014 Personal Hx urinary calculi, Nephrolithiasis - 2014 Phimosis, Phimosis - 2014      PMH Notes:  1898-06-22 00:00:00 - Note: Normal Routine History And Physical Adult   NON-GU PMH: Localized edema, Left, He has edema of the left leg. He sleeps left side down. CT showed diffuse soft tissue edema in  the pelvis. This is probably from obstructive uropathy. - 07/30/2016 Personal history of other endocrine, nutritional and metabolic disease, History of diabetes mellitus - 2014 Diabetes Type 2    FAMILY HISTORY: Alzheimer's Disease - Runs In Family Colon Cancer - Runs In Family Death In The Family Father - Runs In Family Death In The Family Mother - Runs In Family nephrolithiasis - Runs In Family   SOCIAL HISTORY: Marital Status: Widowed Does not drink caffeine.     Notes: 2 sons, 1 daughter    REVIEW OF SYSTEMS:    GU Review Male:   Patient denies frequent urination, hard to postpone urination, burning/ pain with urination, get up at night to urinate, leakage of urine, stream starts and stops, trouble starting your stream, have to strain to urinate , erection problems, and penile pain.  Gastrointestinal (Upper):   Patient denies nausea, vomiting, and indigestion/ heartburn.  Gastrointestinal (Lower):   Patient denies diarrhea and constipation.  Constitutional:   Patient denies fever, night sweats, weight loss, and fatigue.  Skin:   Patient denies skin rash/ lesion and itching.  Eyes:   Patient denies blurred vision and double vision.  Ears/ Nose/ Throat:   Patient denies sore throat and sinus problems.  Hematologic/Lymphatic:   Patient denies swollen glands and easy bruising.  Cardiovascular:   Patient denies leg swelling and chest pains.  Respiratory:   Patient denies cough and shortness of breath.  Endocrine:   Patient denies excessive thirst.  Musculoskeletal:   Patient denies back pain and joint  pain.  Neurological:   Patient denies headaches and dizziness.  Psychologic:   Patient denies depression and anxiety.   VITAL SIGNS:      08/11/2016 08:40 AM  Weight 150 lb / 68.04 kg  Height 66 in / 167.64 cm  BP 154/80 mmHg  Pulse 106 /min  Temperature 98.1 F / 37 C  BMI 24.2 kg/m   GU PHYSICAL EXAMINATION:    Penis: Penis uncircumcised. Balanitis.    MULTI-SYSTEM PHYSICAL  EXAMINATION:    Constitutional: Well-nourished. No physical deformities. Normally developed. Good grooming.  Respiratory: No labored breathing, no use of accessory muscles.   Cardiovascular: Normal temperature, normal extremity pulses, no swelling, no varicosities.  Skin: Skin rash on the lower abdomen and right upper leg under the catheter strap consistent with candidiasis. No paleness, no jaundice, no cyanosis. No lesion, no ulcer.   Neurologic / Psychiatric: Oriented to time, oriented to place, oriented to person. No depression, no anxiety, no agitation.     PAST DATA REVIEWED:  Source Of History:  Patient   07/27/16 01/02/16 09/12/11  PSA  Total PSA 40.5 ng/dl 3.0 ng/dl 2.08   Free PSA   0.84   % Free PSA   40     PROCEDURES:         Flexible Cystoscopy - 52000  Risks, benefits, and some of the potential complications of the procedure were discussed. Cipro 500mg  given for antibiotic prophylaxis.     Meatus:  Normal size. Normal location. Normal condition.  Urethra:  No strictures.  External Sphincter:  Normal.  Verumontanum:  Normal.  Prostate:  Obstructing. Enlarged median lobe. Moderate hyperplasia. 3-4cm  Bladder Neck:  Non-obstructing.  Ureteral Orifices:  Normal location. Normal size. Normal shape. Effluxed clear urine.  Bladder:  Mild trabeculation. Erythematous mucosa from the foley. No tumors. No stones.      The procedure was well tolerated and there were no complications.  His bladder was filled and he attempted a flowrate without success so an foley catheter was reinserted to leg bag drainage.   ASSESSMENT:      ICD-10 Details  1 GU:   BPH w/LUTS - N40.1   2   Balanoposthitis - N47.6   3   Incomplete bladder emptying - R39.14   4   Elevated PSA - R97.20    PLAN:            Medications New Meds: Fluconazole 150 mg tablet 1 tablet PO Every Other Day Please hold atorvastin while on this med  #3  0 Refill(s)    Stop Meds: Ciprofloxacin Hcl 500 mg tablet 1  tablet PO Daily  Discontinue: 08/11/2016  - Reason: The medication cycle was completed.            Schedule Return Visit/Planned Activity: Next Available Appointment - Schedule Surgery             Note: Will scheduled Thulium laser          Document Letter(s):  Created for Patient: Clinical Summary         Notes:   I am going to send diflucan for the rash and he has nystatin to use.   I believe he need to have a prostate resection/vaporization for the BPH with BOO and I will get him set up for a Thulium Laser prostatectomy and prostate [place] and biopsy because of the PSA elevation. I reviewed the risks of bleeding, infection, strictures, incontinence, persistent retention, injury to the bladder and ureters, thrombotic  events and anesthetic complications.

## 2016-09-01 ENCOUNTER — Encounter (HOSPITAL_BASED_OUTPATIENT_CLINIC_OR_DEPARTMENT_OTHER): Payer: Self-pay | Admitting: Anesthesiology

## 2016-09-01 ENCOUNTER — Ambulatory Visit (HOSPITAL_BASED_OUTPATIENT_CLINIC_OR_DEPARTMENT_OTHER)
Admission: RE | Admit: 2016-09-01 | Discharge: 2016-09-01 | Disposition: A | Discharge status: Home / Self Care | Payer: Medicare HMO | Source: Ambulatory Visit | Attending: Urology | Admitting: Urology

## 2016-09-01 ENCOUNTER — Ambulatory Visit (HOSPITAL_COMMUNITY)
Admission: RE | Admit: 2016-09-01 | Discharge: 2016-09-01 | Disposition: A | Discharge status: Home / Self Care | Payer: Medicare HMO | Source: Ambulatory Visit | Attending: Urology | Admitting: Urology

## 2016-09-01 ENCOUNTER — Ambulatory Visit (HOSPITAL_BASED_OUTPATIENT_CLINIC_OR_DEPARTMENT_OTHER): Payer: Medicare HMO | Admitting: Anesthesiology

## 2016-09-01 ENCOUNTER — Encounter (HOSPITAL_BASED_OUTPATIENT_CLINIC_OR_DEPARTMENT_OTHER)
Admission: RE | Disposition: A | Discharge status: Home / Self Care | Payer: Self-pay | Source: Ambulatory Visit | Attending: Urology

## 2016-09-01 DIAGNOSIS — R3914 Feeling of incomplete bladder emptying: Secondary | ICD-10-CM | POA: Diagnosis not present

## 2016-09-01 DIAGNOSIS — Z79899 Other long term (current) drug therapy: Secondary | ICD-10-CM | POA: Diagnosis not present

## 2016-09-01 DIAGNOSIS — N401 Enlarged prostate with lower urinary tract symptoms: Secondary | ICD-10-CM | POA: Diagnosis present

## 2016-09-01 DIAGNOSIS — E119 Type 2 diabetes mellitus without complications: Secondary | ICD-10-CM | POA: Diagnosis not present

## 2016-09-01 DIAGNOSIS — Z7984 Long term (current) use of oral hypoglycemic drugs: Secondary | ICD-10-CM | POA: Insufficient documentation

## 2016-09-01 DIAGNOSIS — D649 Anemia, unspecified: Secondary | ICD-10-CM | POA: Diagnosis not present

## 2016-09-01 DIAGNOSIS — N476 Balanoposthitis: Secondary | ICD-10-CM | POA: Diagnosis not present

## 2016-09-01 DIAGNOSIS — N400 Enlarged prostate without lower urinary tract symptoms: Secondary | ICD-10-CM | POA: Diagnosis not present

## 2016-09-01 DIAGNOSIS — I10 Essential (primary) hypertension: Secondary | ICD-10-CM | POA: Diagnosis not present

## 2016-09-01 DIAGNOSIS — R972 Elevated prostate specific antigen [PSA]: Secondary | ICD-10-CM | POA: Insufficient documentation

## 2016-09-01 DIAGNOSIS — R338 Other retention of urine: Secondary | ICD-10-CM | POA: Diagnosis not present

## 2016-09-01 DIAGNOSIS — E7800 Pure hypercholesterolemia, unspecified: Secondary | ICD-10-CM | POA: Diagnosis not present

## 2016-09-01 HISTORY — PX: THULIUM LASER TURP (TRANSURETHRAL RESECTION OF PROSTATE): SHX6744

## 2016-09-01 HISTORY — DX: Presence of urogenital implants: Z96.0

## 2016-09-01 HISTORY — DX: Personal history of other diseases of the digestive system: Z87.19

## 2016-09-01 HISTORY — PX: PROSTATE BIOPSY: SHX241

## 2016-09-01 LAB — POCT I-STAT, CHEM 8
BUN: 34 mg/dL — AB (ref 6–20)
CALCIUM ION: 1.24 mmol/L (ref 1.15–1.40)
CHLORIDE: 107 mmol/L (ref 101–111)
Creatinine, Ser: 1.2 mg/dL (ref 0.61–1.24)
Glucose, Bld: 237 mg/dL — ABNORMAL HIGH (ref 65–99)
HEMATOCRIT: 43 % (ref 39.0–52.0)
Hemoglobin: 14.6 g/dL (ref 13.0–17.0)
POTASSIUM: 4.9 mmol/L (ref 3.5–5.1)
SODIUM: 145 mmol/L (ref 135–145)
TCO2: 21 mmol/L (ref 0–100)

## 2016-09-01 LAB — GLUCOSE, CAPILLARY: GLUCOSE-CAPILLARY: 233 mg/dL — AB (ref 65–99)

## 2016-09-01 SURGERY — THULIUM LASER TURP (TRANSURETHRAL RESECTION OF PROSTATE)
Anesthesia: General

## 2016-09-01 MED ORDER — HYDROMORPHONE HCL 1 MG/ML IJ SOLN
0.2500 mg | INTRAMUSCULAR | Status: DC | PRN
  Administered 2016-09-01: 0.25 mg via INTRAVENOUS
  Administered 2016-09-01: 0.5 mg via INTRAVENOUS
  Administered 2016-09-01: 0.25 mg via INTRAVENOUS
  Filled 2016-09-01: qty 0.5

## 2016-09-01 MED ORDER — LIDOCAINE 2% (20 MG/ML) 5 ML SYRINGE
INTRAMUSCULAR | Status: AC
  Filled 2016-09-01: qty 5

## 2016-09-01 MED ORDER — FLEET ENEMA 7-19 GM/118ML RE ENEM
1.0000 | ENEMA | Freq: Once | RECTAL | Status: DC
  Filled 2016-09-01: qty 1

## 2016-09-01 MED ORDER — TRAMADOL-ACETAMINOPHEN 37.5-325 MG PO TABS: 1.0000 | ORAL_TABLET | Freq: Four times a day (QID) | ORAL | 0 refills | Status: DC | PRN

## 2016-09-01 MED ORDER — MIDAZOLAM HCL 2 MG/2ML IJ SOLN
INTRAMUSCULAR | Status: AC
  Filled 2016-09-01: qty 2

## 2016-09-01 MED ORDER — ACETAMINOPHEN 650 MG RE SUPP
650.0000 mg | RECTAL | Status: DC | PRN
  Filled 2016-09-01: qty 1

## 2016-09-01 MED ORDER — SULFAMETHOXAZOLE-TRIMETHOPRIM 800-160 MG PO TABS: 1.0000 | ORAL_TABLET | Freq: Two times a day (BID) | ORAL | 0 refills | Status: DC

## 2016-09-01 MED ORDER — SODIUM CHLORIDE 0.9 % IV SOLN
INTRAVENOUS | Status: DC
  Filled 2016-09-01: qty 1000

## 2016-09-01 MED ORDER — LIDOCAINE 2% (20 MG/ML) 5 ML SYRINGE
INTRAMUSCULAR | Status: DC | PRN
  Administered 2016-09-01: 60 mg via INTRAVENOUS

## 2016-09-01 MED ORDER — PROPOFOL 10 MG/ML IV BOLUS
INTRAVENOUS | Status: AC
  Filled 2016-09-01: qty 20

## 2016-09-01 MED ORDER — METOCLOPRAMIDE HCL 5 MG/ML IJ SOLN
INTRAMUSCULAR | Status: AC
  Filled 2016-09-01: qty 2

## 2016-09-01 MED ORDER — SODIUM CHLORIDE 0.9% FLUSH
3.0000 mL | INTRAVENOUS | Status: DC | PRN
  Filled 2016-09-01: qty 3

## 2016-09-01 MED ORDER — DEXAMETHASONE SODIUM PHOSPHATE 10 MG/ML IJ SOLN
INTRAMUSCULAR | Status: AC
  Filled 2016-09-01: qty 1

## 2016-09-01 MED ORDER — FENTANYL CITRATE (PF) 100 MCG/2ML IJ SOLN
INTRAMUSCULAR | Status: DC | PRN
  Administered 2016-09-01 (×3): 25 ug via INTRAVENOUS
  Administered 2016-09-01: 50 ug via INTRAVENOUS
  Administered 2016-09-01 (×3): 25 ug via INTRAVENOUS

## 2016-09-01 MED ORDER — FENTANYL CITRATE (PF) 100 MCG/2ML IJ SOLN
INTRAMUSCULAR | Status: AC
  Filled 2016-09-01: qty 2

## 2016-09-01 MED ORDER — MIDAZOLAM HCL 5 MG/5ML IJ SOLN
INTRAMUSCULAR | Status: DC | PRN
  Administered 2016-09-01: 2 mg via INTRAVENOUS

## 2016-09-01 MED ORDER — PROPOFOL 10 MG/ML IV BOLUS
INTRAVENOUS | Status: DC | PRN
  Administered 2016-09-01: 200 mg via INTRAVENOUS

## 2016-09-01 MED ORDER — ONDANSETRON HCL 4 MG/2ML IJ SOLN
INTRAMUSCULAR | Status: AC
  Filled 2016-09-01: qty 2

## 2016-09-01 MED ORDER — DEXTROSE 5 % IV SOLN
5.0000 mg/kg | INTRAVENOUS | Status: AC
  Administered 2016-09-01: 330 mg via INTRAVENOUS
  Filled 2016-09-01 (×2): qty 8.25

## 2016-09-01 MED ORDER — ONDANSETRON HCL 4 MG/2ML IJ SOLN
INTRAMUSCULAR | Status: DC | PRN
  Administered 2016-09-01: 4 mg via INTRAVENOUS

## 2016-09-01 MED ORDER — LACTATED RINGERS IV SOLN
INTRAVENOUS | Status: DC
  Administered 2016-09-01 (×2): via INTRAVENOUS
  Filled 2016-09-01: qty 1000

## 2016-09-01 MED ORDER — FENTANYL CITRATE (PF) 100 MCG/2ML IJ SOLN
25.0000 ug | INTRAMUSCULAR | Status: DC | PRN
  Filled 2016-09-01: qty 1

## 2016-09-01 MED ORDER — OXYCODONE HCL 5 MG PO TABS
5.0000 mg | ORAL_TABLET | ORAL | Status: DC | PRN
  Filled 2016-09-01: qty 2

## 2016-09-01 MED ORDER — CEFAZOLIN SODIUM-DEXTROSE 2-4 GM/100ML-% IV SOLN
INTRAVENOUS | Status: AC
  Filled 2016-09-01: qty 100

## 2016-09-01 MED ORDER — METOCLOPRAMIDE HCL 5 MG/ML IJ SOLN
INTRAMUSCULAR | Status: DC | PRN
  Administered 2016-09-01: 10 mg via INTRAVENOUS

## 2016-09-01 MED ORDER — HYDROMORPHONE HCL 2 MG/ML IJ SOLN
INTRAMUSCULAR | Status: AC
  Filled 2016-09-01: qty 1

## 2016-09-01 MED ORDER — SODIUM CHLORIDE 0.9 % IR SOLN
Status: DC | PRN
  Administered 2016-09-01 (×6): 3000 mL via INTRAVESICAL

## 2016-09-01 MED ORDER — CEFAZOLIN SODIUM-DEXTROSE 2-4 GM/100ML-% IV SOLN
2.0000 g | INTRAVENOUS | Status: AC
Start: 2016-09-01 — End: 2016-09-01
  Administered 2016-09-01: 2 g via INTRAVENOUS
  Filled 2016-09-01: qty 100

## 2016-09-01 MED ORDER — KETOROLAC TROMETHAMINE 30 MG/ML IJ SOLN
30.0000 mg | Freq: Once | INTRAMUSCULAR | Status: DC | PRN
  Filled 2016-09-01: qty 1

## 2016-09-01 MED ORDER — PROMETHAZINE HCL 25 MG/ML IJ SOLN
6.2500 mg | INTRAMUSCULAR | Status: DC | PRN
  Filled 2016-09-01: qty 1

## 2016-09-01 MED ORDER — SODIUM CHLORIDE 0.9% FLUSH
3.0000 mL | Freq: Two times a day (BID) | INTRAVENOUS | Status: DC
  Filled 2016-09-01: qty 3

## 2016-09-01 MED ORDER — ACETAMINOPHEN 325 MG PO TABS
650.0000 mg | ORAL_TABLET | ORAL | Status: DC | PRN
  Filled 2016-09-01: qty 2

## 2016-09-01 MED ORDER — SODIUM CHLORIDE 0.9 % IV SOLN
250.0000 mL | INTRAVENOUS | Status: DC | PRN
  Filled 2016-09-01: qty 250

## 2016-09-01 SURGICAL SUPPLY — 36 items
BAG DRAIN URO-CYSTO SKYTR STRL (DRAIN) ×2 IMPLANT
BAG DRN UROCATH (DRAIN) ×1
BAG URINE DRAINAGE (UROLOGICAL SUPPLIES) ×2 IMPLANT
CATH FOLEY 2WAY SLVR 30CC 22FR (CATHETERS) ×1 IMPLANT
CLOTH BEACON ORANGE TIMEOUT ST (SAFETY) ×2 IMPLANT
ELECT BIVAP BIPO 22/24 DONUT (ELECTROSURGICAL)
ELECTRD BIVAP BIPO 22/24 DONUT (ELECTROSURGICAL) IMPLANT
GLOVE BIO SURGEON STRL SZ7 (GLOVE) ×1 IMPLANT
GLOVE BIOGEL PI IND STRL 7.0 (GLOVE) IMPLANT
GLOVE BIOGEL PI INDICATOR 7.0 (GLOVE) ×1
GLOVE SURG SS PI 8.0 STRL IVOR (GLOVE) ×2 IMPLANT
GOWN STRL REUS W/ TWL XL LVL3 (GOWN DISPOSABLE) ×1 IMPLANT
GOWN STRL REUS W/TWL XL LVL3 (GOWN DISPOSABLE) ×2
HOLDER FOLEY CATH W/STRAP (MISCELLANEOUS) ×1 IMPLANT
INST BIOPSY MAXCORE 18GX25 (NEEDLE) ×1 IMPLANT
INSTR BIOPSY MAXCORE 18GX20 (NEEDLE) IMPLANT
IV NS 1000ML (IV SOLUTION) ×2
IV NS 1000ML BAXH (IV SOLUTION) ×1 IMPLANT
IV NS IRRIG 3000ML ARTHROMATIC (IV SOLUTION) ×2 IMPLANT
IV SET EXTENSION GRAVITY 40 LF (IV SETS) ×2 IMPLANT
KIT RM TURNOVER CYSTO AR (KITS) ×2 IMPLANT
LASER REVOLIX PROCEDURE (MISCELLANEOUS) ×2 IMPLANT
LOOP CUT BIPOLAR 24F LRG (ELECTROSURGICAL) IMPLANT
MANIFOLD NEPTUNE II (INSTRUMENTS) ×1 IMPLANT
NDL SAFETY ECLIPSE 18X1.5 (NEEDLE) IMPLANT
NDL SPNL 22GX7 QUINCKE BK (NEEDLE) IMPLANT
NEEDLE HYPO 18GX1.5 SHARP (NEEDLE)
NEEDLE SPNL 22GX7 QUINCKE BK (NEEDLE) IMPLANT
PACK CYSTO (CUSTOM PROCEDURE TRAY) ×2 IMPLANT
SURGILUBE 2OZ TUBE FLIPTOP (MISCELLANEOUS) ×2 IMPLANT
SYR 30ML LL (SYRINGE) IMPLANT
SYR CONTROL 10ML LL (SYRINGE) IMPLANT
SYRINGE IRR TOOMEY STRL 70CC (SYRINGE) IMPLANT
TOWEL OR 17X24 6PK STRL BLUE (TOWEL DISPOSABLE) ×2 IMPLANT
TUBE CONNECTING 12X1/4 (SUCTIONS) ×1 IMPLANT
UNDERPAD 30X30 INCONTINENT (UNDERPADS AND DIAPERS) ×2 IMPLANT

## 2016-09-01 NOTE — Anesthesia Procedure Notes (Signed)
Procedure Name: LMA Insertion Date/Time: 09/01/2016 10:15 AM Performed by: Justice Rocher Pre-anesthesia Checklist: Patient identified, Emergency Drugs available, Suction available and Patient being monitored Patient Re-evaluated:Patient Re-evaluated prior to inductionOxygen Delivery Method: Circle system utilized Preoxygenation: Pre-oxygenation with 100% oxygen Intubation Type: IV induction Ventilation: Mask ventilation without difficulty LMA: LMA inserted LMA Size: 4.0 Number of attempts: 1 Airway Equipment and Method: Bite block Placement Confirmation: positive ETCO2 and breath sounds checked- equal and bilateral Tube secured with: Tape Dental Injury: Teeth and Oropharynx as per pre-operative assessment

## 2016-09-01 NOTE — Anesthesia Preprocedure Evaluation (Addendum)
Anesthesia Evaluation  Patient identified by MRN, date of birth, ID band Patient awake    Reviewed: Allergy & Precautions, NPO status , Patient's Chart, lab work & pertinent test results  Airway Mallampati: II  TM Distance: >3 FB Neck ROM: Full    Dental no notable dental hx.    Pulmonary neg pulmonary ROS,    Pulmonary exam normal breath sounds clear to auscultation       Cardiovascular hypertension, Normal cardiovascular exam Rhythm:Regular Rate:Normal     Neuro/Psych negative neurological ROS  negative psych ROS   GI/Hepatic negative GI ROS, Neg liver ROS,   Endo/Other  diabetes  Renal/GU Renal InsufficiencyRenal disease  negative genitourinary   Musculoskeletal negative musculoskeletal ROS (+)   Abdominal   Peds negative pediatric ROS (+)  Hematology negative hematology ROS (+)   Anesthesia Other Findings   Reproductive/Obstetrics negative OB ROS                            Anesthesia Physical Anesthesia Plan  ASA: II  Anesthesia Plan: General   Post-op Pain Management:    Induction: Intravenous  Airway Management Planned: LMA  Additional Equipment:   Intra-op Plan:   Post-operative Plan: Extubation in OR  Informed Consent: I have reviewed the patients History and Physical, chart, labs and discussed the procedure including the risks, benefits and alternatives for the proposed anesthesia with the patient or authorized representative who has indicated his/her understanding and acceptance.   Dental advisory given  Plan Discussed with: CRNA and Surgeon  Anesthesia Plan Comments:         Anesthesia Quick Evaluation

## 2016-09-01 NOTE — Brief Op Note (Signed)
09/01/2016  11:46 AM  PATIENT:  Eric Guzman  66 y.o. male  PRE-OPERATIVE DIAGNOSIS:  BENIGN PROSTATE HYPERPLASIA AND ELEVATED PSA  POST-OPERATIVE DIAGNOSIS:  BENIGN PROSTATE HYPERPLASIA AND ELEVATED PSA  PROCEDURE:  Procedure(s): THULIUM LASER TURP (TRANSURETHRAL RESECTION OF PROSTATE) (N/A) BIOPSY TRANSRECTAL ULTRASONIC PROSTATE (TUBP) (N/A)  SURGEON:  Surgeon(s) and Role:    * Irine Seal, MD - Primary  PHYSICIAN ASSISTANT:   ASSISTANTS: none   ANESTHESIA:   general  EBL:  Total I/O In: 1100 [I.V.:1100] Out: 10 [Blood:10]  BLOOD ADMINISTERED:none  DRAINS: Urinary Catheter (Foley)   LOCAL MEDICATIONS USED:  NONE  SPECIMEN:  Source of Specimen:  12 core prostate biopsy  DISPOSITION OF SPECIMEN:  PATHOLOGY  COUNTS:  YES  TOURNIQUET:  * No tourniquets in log *  DICTATION: .Other Dictation: Dictation Number [id number]  PLAN OF CARE: Discharge to home after PACU  PATIENT DISPOSITION:  PACU - hemodynamically stable.   Delay start of Pharmacological VTE agent (>24hrs) due to surgical blood loss or risk of bleeding: yes

## 2016-09-01 NOTE — Interval H&P Note (Signed)
History and Physical Interval Note:  09/01/2016 9:00 AM  Eric Guzman  has presented today for surgery, with the diagnosis of BENIGN PROSTATE HYPERPLASIA AND ELEVATED PSA  The various methods of treatment have been discussed with the patient and family. After consideration of risks, benefits and other options for treatment, the patient has consented to  Procedure(s): THULIUM LASER TURP (TRANSURETHRAL RESECTION OF PROSTATE) (N/A) BIOPSY TRANSRECTAL ULTRASONIC PROSTATE (TUBP) (N/A) as a surgical intervention .  The patient's history has been reviewed, patient examined, no change in status, stable for surgery.  I have reviewed the patient's chart and labs.  Questions were answered to the patient's satisfaction.     [name]

## 2016-09-01 NOTE — Transfer of Care (Signed)
Last Vitals:  Vitals:   09/01/16 0848  BP: (!) 163/74  Pulse: 100  Resp: 16  Temp: 36.8 C    Last Pain:  Vitals:   09/01/16 0848  TempSrc: Oral      Patients Stated Pain Goal: 5 (09/01/16 0905)  Immediate Anesthesia Transfer of Care Note  Patient: ERIS BRECK  Procedure(s) Performed: Procedure(s) (LRB): THULIUM LASER TURP (TRANSURETHRAL RESECTION OF PROSTATE) (N/A) BIOPSY TRANSRECTAL ULTRASONIC PROSTATE (TUBP) (N/A)  Patient Location: PACU  Anesthesia Type: General  Level of Consciousness: awake, alert  and oriented  Airway & Oxygen Therapy: Patient Spontanous Breathing and Patient connected to nasal cannula oxygen  Post-op Assessment: Report given to PACU RN and Post -op Vital signs reviewed and stable  Post vital signs: Reviewed and stable  Complications: No apparent anesthesia complications

## 2016-09-01 NOTE — Discharge Instructions (Addendum)
Prostate Laser Surgery, Care After This sheet gives you information about how to care for yourself after your procedure. Your health care provider may also give you more specific instructions. If you have problems or questions, contact your health care provider. What can I expect after the procedure? For the first few weeks after the procedure:  You will feel a need to urinate often.  You may have blood in your urine.  You may feel a sudden need to urinate. Once your urinary catheter is removed, you may have a burning feeling when you urinate, especially at the end of urination. This feeling usually passes within 3-5 days. Follow these instructions at home: Activity   Return to your normal activities as told by your health care provider. Ask your health care provider what activities are safe for you.  Do not do vigorous exercise for 1 week or as told by your health care provider.  Do not lift anything that is heavier than 10 lb (4.5 kg) until your health care provider say it is safe.  Avoid sexual activity for 4-6 weeks or as told by your health care provider.  Do not ride in a car for extended periods of time for 2 weeks or as told by your health care provider.  Do not drive for 1 week Diet   Eat foods that are high in fiber, such as fresh fruits and vegetables, whole grains, and beans.  Drink enough fluid to keep your urine clear or pale yellow. Medicines   Take over-the-counter and prescription medicines, including stool softeners, only as told by your health care provider.  If you were prescribed an antibiotic medicine, take it as told by your health care provider. Do not stop taking the antibiotic even if you start to feel better. General instructions   If you were given elastic support stockings, wear them as told by your health care provider.  Do not strain to have a bowel movement. Straining may lead to bleeding from the prostate and cause clots to form and cause  trouble urinating.  Keep all follow-up visits as told by your health care provider. This is important. Contact a health care provider if:  You have a fever or chills.  You have spasms or pain with the urinary catheter still in place.  Once the catheter has been removed, you experience difficulty starting your stream when attempting to urinate. Get help right away if:  There is a blockage in your catheter.  Your catheter has been removed and you are suddenly unable to urinate.  Your urine smells unusually bad.  You start to have blood clots in your urine.  The blood in your urine becomes persistent or gets thick.  You develop chest pains.  You develop shortness of breath.  You develop swelling or pain in your leg. Summary  You may notice urinary symptoms for a few weeks after your procedure.  Follow instructions from your health care provider regarding activity restrictions such as lifting, exercise, and sexual activity.  Contact your health care provider if you have any unusual symptoms during your recovery. This information is not intended to replace advice given to you by your health care provider. Make sure you discuss any questions you have with your health care provider. Document Released: 06/08/2005 Document Revised: 01/24/2016 Document Reviewed: 01/24/2016 Elsevier Interactive Patient Education  2017 [place] [facility] Instructions  Activity: Get plenty of rest for the remainder of the day. A responsible adult should stay with  you for 24 hours following the procedure.  For the next 24 hours, DO NOT: -Drive a car -Paediatric nurse -Drink alcoholic beverages -Take any medication unless instructed by your physician -Make any legal decisions or sign important papers.  Meals: Start with liquid foods such as gelatin or soup. Progress to regular foods as tolerated. Avoid greasy, spicy, heavy foods. If nausea and/or vomiting occur, drink only  clear liquids until the nausea and/or vomiting subsides. Call your physician if vomiting continues.  Special Instructions/Symptoms: Your throat may feel dry or sore from the anesthesia or the breathing tube placed in your throat during surgery. If this causes discomfort, gargle with warm salt water. The discomfort should disappear within 24 hours.  If you had a scopolamine patch placed behind your ear for the management of post- operative nausea and/or vomiting:  1. The medication in the patch is effective for 72 hours, after which it should be removed.  Wrap patch in a tissue and discard in the trash. Wash hands thoroughly with soap and water. 2. You may remove the patch earlier than 72 hours if you experience unpleasant side effects which may include dry mouth, dizziness or visual disturbances. 3. Avoid touching the patch. Wash your hands with soap and water after contact with the patch.

## 2016-09-02 ENCOUNTER — Encounter (HOSPITAL_BASED_OUTPATIENT_CLINIC_OR_DEPARTMENT_OTHER): Payer: Self-pay | Admitting: Urology

## 2016-09-02 NOTE — Anesthesia Postprocedure Evaluation (Addendum)
Anesthesia Post Note  Patient: Eric Guzman  Procedure(s) Performed: Procedure(s) (LRB): THULIUM LASER TURP (TRANSURETHRAL RESECTION OF PROSTATE) (N/A) BIOPSY TRANSRECTAL ULTRASONIC PROSTATE (TUBP) (N/A)  Patient location during evaluation: PACU Anesthesia Type: General Level of consciousness: awake and alert Pain management: pain level controlled Vital Signs Assessment: post-procedure vital signs reviewed and stable Respiratory status: spontaneous breathing, nonlabored ventilation, respiratory function stable and patient connected to nasal cannula oxygen Cardiovascular status: blood pressure returned to baseline and stable Postop Assessment: no signs of nausea or vomiting Anesthetic complications: no       Last Vitals:  Vitals:   09/01/16 1300 09/01/16 1415  BP: (!) 128/55 139/63  Pulse: 72 75  Resp: 14 16  Temp:  36.7 C    Last Pain:  Vitals:   09/01/16 1415  TempSrc:   PainSc: 2                  Kennah Hehr S

## 2016-09-02 NOTE — Op Note (Signed)
NAME:  Eric Guzman, Eric Guzman                     ACCOUNT NO.:  MEDICAL RECORD NO.:  [id number]  LOCATION:                                 FACILITY:  PHYSICIAN:  Marshall Cork. Jeffie Pollock, M.D.         DATE OF BIRTH:  DATE OF PROCEDURE:  09/01/2016 DATE OF DISCHARGE:                              OPERATIVE REPORT   SURGEON:  Marshall Cork. Jeffie Pollock, M.D.  PROCEDURE:  Transrectal ultrasound with ultrasound-guided prostate biopsy. Thulium laser ablation of the prostate.  PREOPERATIVE DIAGNOSES:  Benign prostatic hyperplasia with elevated prostate specific antigen and urinary retention.  POSTOPERATIVE DIAGNOSES:  Benign prostatic hyperplasia with elevated prostate specific antigen and urinary retention.  ANESTHESIA:  General.  SPECIMEN:  Prostate, 12-core prostate biopsy.  DRAINS:  A 20-French Foley catheter.  BLOOD LOSS:  Minimal.  COMPLICATIONS:  None.  INDICATIONS:  Eric Guzman is a 66 year old white male with a history of an elevated PSA and urinary retention.  Cystoscopically, had BPH and bladder outlet obstruction.  After reviewing the options, we are going to proceed with prostate ultrasound and biopsy, thulium laser vaporization of the prostate.  FINDINGS OF PROCEDURE:  He was taken to the operating room, where he was given Ancef and gentamicin.  A general anesthetic was induced.  He was placed in lithotomy position.  The 10-megahertz ultrasound probe was assembled and inserted.  Scanning was performed.  The scan revealed a 65 g prostate with normal seminal vesicles and no hypoechoic lesions.  There was a large transitional zone with a small cyst in the left prostate.  Once the diagnostic scan was performed, 12-core biopsy was obtained in the standard configuration with an 18-gauge biopsy needle.  The patient was then prepped with Betadine solution and was draped in the usual sterile fashion.  The laser scope was then passed per urethra. This was fitted with a 30-degree lens, the laser bridge  and an 800- micron thulium laser fiber.  Inspection revealed a normal urethra.  The external sphincter was intact.  The prostatic urethra was approximately 4 cm in length with trilobar hyperplasia and moderately large middle lobe.  Examination of the bladder revealed erythema from the catheter and some trabeculation, but no tumors or stones were noted.  The ureteral orifices were away from the bladder neck.  The holmium laser was initially set on 80 watts and troughs were created on the lateral aspects of the middle lobe.  The middle lobe was then vaporized in level with the bladder neck.  The power was sequentially increased from 80 to 100, and eventually to 150 watts during the process.  The floor of the prostate was vaporized out to the verumontanum.  The left lobe of the prostate was then vaporized from bladder neck to apex.  This was followed by the right lobe.  Additional apical and anterior ablating was performed as needed, and the bladder neck incisions were deepened sequentially through the case with further ablation of the middle lobe.  After completing the laser ablation, there were an adequate channel and minimal bleeding.  The bladder was evacuated free of debris, the scope was removed.  Once hemostasis was  secured and pressure on the bladder produced a good stream.  At this point, a 20-French Foley catheter was inserted and the balloon was filled with 30 mL of sterile fluid.  The catheter was irrigated with clear return and placed to straight drainage.  The total laser time was 33 minutes and 33 seconds and a total of [phone] joules was used.  He was taken down from lithotomy position.  His anesthetic was reversed. He was moved to recovery room in stable condition.  There were no complications.     Marshall Cork. Jeffie Pollock, M.D.     JJW/MEDQ  D:  09/01/2016  T:  09/01/2016  Job:  [id number]

## 2016-09-04 ENCOUNTER — Ambulatory Visit (INDEPENDENT_AMBULATORY_CARE_PROVIDER_SITE_OTHER): Payer: Medicare HMO | Admitting: Urology

## 2016-09-04 DIAGNOSIS — R3914 Feeling of incomplete bladder emptying: Secondary | ICD-10-CM | POA: Diagnosis not present

## 2016-09-08 ENCOUNTER — Ambulatory Visit (INDEPENDENT_AMBULATORY_CARE_PROVIDER_SITE_OTHER): Payer: Medicare HMO | Admitting: Urology

## 2016-09-08 DIAGNOSIS — L271 Localized skin eruption due to drugs and medicaments taken internally: Secondary | ICD-10-CM | POA: Diagnosis not present

## 2016-09-15 DIAGNOSIS — R3914 Feeling of incomplete bladder emptying: Secondary | ICD-10-CM | POA: Diagnosis not present

## 2016-09-26 ENCOUNTER — Other Ambulatory Visit: Payer: Self-pay | Admitting: "Endocrinology

## 2016-10-22 ENCOUNTER — Other Ambulatory Visit: Payer: Self-pay | Admitting: "Endocrinology

## 2016-10-22 DIAGNOSIS — N183 Chronic kidney disease, stage 3 (moderate): Secondary | ICD-10-CM | POA: Diagnosis not present

## 2016-10-22 DIAGNOSIS — E1122 Type 2 diabetes mellitus with diabetic chronic kidney disease: Secondary | ICD-10-CM | POA: Diagnosis not present

## 2016-10-22 DIAGNOSIS — E1165 Type 2 diabetes mellitus with hyperglycemia: Secondary | ICD-10-CM | POA: Diagnosis not present

## 2016-10-22 LAB — COMPREHENSIVE METABOLIC PANEL
ALK PHOS: 58 U/L (ref 40–115)
ALT: 19 U/L (ref 9–46)
AST: 20 U/L (ref 10–35)
Albumin: 3.8 g/dL (ref 3.6–5.1)
BILIRUBIN TOTAL: 0.5 mg/dL (ref 0.2–1.2)
BUN: 22 mg/dL (ref 7–25)
CALCIUM: 9 mg/dL (ref 8.6–10.3)
CO2: 23 mmol/L (ref 20–31)
CREATININE: 1.11 mg/dL (ref 0.70–1.25)
Chloride: 109 mmol/L (ref 98–110)
GLUCOSE: 130 mg/dL — AB (ref 65–99)
Potassium: 4.5 mmol/L (ref 3.5–5.3)
Sodium: 141 mmol/L (ref 135–146)
Total Protein: 6.5 g/dL (ref 6.1–8.1)

## 2016-10-22 LAB — HEMOGLOBIN A1C: Hemoglobin A1C: 7.6

## 2016-10-23 LAB — HEMOGLOBIN A1C
Hgb A1c MFr Bld: 7.6 % — ABNORMAL HIGH (ref <5.7)
Mean Plasma Glucose: 171 mg/dL

## 2016-10-26 ENCOUNTER — Ambulatory Visit: Payer: Medicare HMO | Admitting: "Endocrinology

## 2016-10-26 DIAGNOSIS — Z6824 Body mass index (BMI) 24.0-24.9, adult: Secondary | ICD-10-CM | POA: Diagnosis not present

## 2016-10-26 DIAGNOSIS — I10 Essential (primary) hypertension: Secondary | ICD-10-CM | POA: Diagnosis not present

## 2016-10-26 DIAGNOSIS — E1165 Type 2 diabetes mellitus with hyperglycemia: Secondary | ICD-10-CM | POA: Diagnosis not present

## 2016-10-26 DIAGNOSIS — E782 Mixed hyperlipidemia: Secondary | ICD-10-CM | POA: Diagnosis not present

## 2016-10-26 DIAGNOSIS — R972 Elevated prostate specific antigen [PSA]: Secondary | ICD-10-CM | POA: Diagnosis not present

## 2016-10-26 LAB — ANTI-ISLET CELL ANTIBODY

## 2016-10-28 ENCOUNTER — Ambulatory Visit (INDEPENDENT_AMBULATORY_CARE_PROVIDER_SITE_OTHER): Payer: Medicare HMO | Admitting: "Endocrinology

## 2016-10-28 ENCOUNTER — Other Ambulatory Visit: Payer: Self-pay | Admitting: "Endocrinology

## 2016-10-28 ENCOUNTER — Encounter: Payer: Self-pay | Admitting: "Endocrinology

## 2016-10-28 VITALS — BP 133/76 | HR 76 | Ht 66.0 in | Wt 150.0 lb

## 2016-10-28 DIAGNOSIS — I10 Essential (primary) hypertension: Secondary | ICD-10-CM | POA: Diagnosis not present

## 2016-10-28 DIAGNOSIS — E7800 Pure hypercholesterolemia, unspecified: Secondary | ICD-10-CM

## 2016-10-28 DIAGNOSIS — N183 Chronic kidney disease, stage 3 (moderate): Secondary | ICD-10-CM | POA: Diagnosis not present

## 2016-10-28 DIAGNOSIS — IMO0002 Reserved for concepts with insufficient information to code with codable children: Secondary | ICD-10-CM

## 2016-10-28 DIAGNOSIS — E1122 Type 2 diabetes mellitus with diabetic chronic kidney disease: Secondary | ICD-10-CM

## 2016-10-28 DIAGNOSIS — E1165 Type 2 diabetes mellitus with hyperglycemia: Secondary | ICD-10-CM

## 2016-10-28 LAB — GLUTAMIC ACID DECARBOXYLASE AUTO ABS: Glutamic Acid Decarb Ab: <5 IU/mL (ref <5)

## 2016-10-28 MED ORDER — SITAGLIPTIN PHOSPHATE 50 MG PO TABS
50.0000 mg | ORAL_TABLET | Freq: Every day | ORAL | 3 refills | Status: DC
Start: 2016-10-28 — End: 2017-01-21

## 2016-10-28 NOTE — Progress Notes (Signed)
Subjective:    Patient ID: Eric Guzman, male    DOB: [birth year]. Patient is being seen in f/u for management of diabetes requested by  Sharilyn Sites, MD  Past Medical History:  Diagnosis Date  . Anemia   . Diabetes ([place])   . Foley catheter in place 07/21/2016  . Hx of duodenal ulcer 2013  . Hypercholesteremia   . Hypertension    Past Surgical History:  Procedure Laterality Date  . COLONOSCOPY  12/15/2004   RMR:.  Anal papilla.  Otherwise, normal rectum and colon  . COLONOSCOPY  Oct 2011   Dr. Gala Romney: normal  . COLONOSCOPY N/A 04/11/2014   Dr. Gala Romney: Anal papilla and internal hemorroids; otherwise normal ileocolonoscopy  . ESOPHAGOGASTRODUODENOSCOPY N/A 04/11/2014   Dr. Gala Romney: erosive reflux esophagitis.  Gastric ulcer- status post biopsy. duodenal erosions-status post biopsy, negative H.pylori  . ESOPHAGOGASTRODUODENOSCOPY N/A 08/08/2014   Procedure: ESOPHAGOGASTRODUODENOSCOPY (EGD);  Surgeon: Daneil Dolin, MD;  Location: [facility];  Service: Endoscopy;  Laterality: N/A;  1000am  . PROSTATE BIOPSY N/A 09/01/2016   Procedure: BIOPSY TRANSRECTAL ULTRASONIC PROSTATE (TUBP);  Surgeon: Irine Seal, MD;  Location: [facility];  Service: Urology;  Laterality: N/A;  . THULIUM LASER TURP (TRANSURETHRAL RESECTION OF PROSTATE) N/A 09/01/2016   Procedure: THULIUM LASER TURP (TRANSURETHRAL RESECTION OF PROSTATE);  Surgeon: Irine Seal, MD;  Location: [facility];  Service: Urology;  Laterality: N/A;   Social History   Social History  . Marital status: Widowed    Spouse name: N/A  . Number of children: N/A  . Years of education: N/A   Occupational History  . retired     Dealer   Social History Main Topics  . Smoking status: Never Smoker  . Smokeless tobacco: Never Used  . Alcohol use No  . Drug use: No  . Sexual activity: Not Asked   Other Topics Concern  . None   Social History Narrative  . None   Outpatient Encounter Prescriptions as  of 10/28/2016  Medication Sig  . tamsulosin (FLOMAX) 0.4 MG CAPS capsule Take 0.4 mg by mouth daily.  Marland Kitchen atorvastatin (LIPITOR) 20 MG tablet Take 20 mg by mouth daily.  Marland Kitchen lisinopril (PRINIVIL,ZESTRIL) 20 MG tablet Take 1 tablet (20 mg total) by mouth daily.  . metFORMIN (GLUCOPHAGE) 500 MG tablet TAKE ONE TABLET TWICE DAILY WITH MEALS  . Multiple Vitamin (MULTIVITAMIN) tablet Take 1 tablet by mouth daily. WITH IRON  . pantoprazole (PROTONIX) 40 MG tablet TAKE ONE (1) TABLET BY MOUTH EVERY DAY  . sitaGLIPtin (JANUVIA) 50 MG tablet Take 1 tablet (50 mg total) by mouth daily.  . traMADol-acetaminophen (ULTRACET) 37.5-325 MG tablet Take 1 tablet by mouth every 6 (six) hours as needed.  . [DISCONTINUED] sitaGLIPtin (JANUVIA) 50 MG tablet Take 1 tablet (50 mg total) by mouth daily. (Patient taking differently: Take 100 mg by mouth daily. )  . [DISCONTINUED] sulfamethoxazole-trimethoprim (BACTRIM DS,SEPTRA DS) 800-160 MG tablet Take 1 tablet by mouth 2 (two) times daily.   No facility-administered encounter medications on file as of 10/28/2016.    ALLERGIES: No Known Allergies VACCINATION STATUS:  There is no immunization history on file for this patient.  Diabetes  He presents for his follow-up diabetic visit. He has type 2 diabetes mellitus. Onset time: He was diagnosed at approximate age of 1 years. His disease course has been stable. There are no hypoglycemic associated symptoms. Pertinent negatives for hypoglycemia include no confusion, headaches, pallor or seizures. There  are no diabetic associated symptoms. Pertinent negatives for diabetes include no chest pain, no fatigue, no polydipsia, no polyphagia, no polyuria and no weakness. There are no hypoglycemic complications. Symptoms are stable. Diabetic complications include nephropathy and retinopathy. Risk factors for coronary artery disease include diabetes mellitus, dyslipidemia, hypertension, male sex and tobacco exposure. Current diabetic  treatment includes oral agent (dual therapy) (He is taking Januvia 100 mg by mouth twice a day and glimepiride 4 mg by mouth twice a day). His weight is stable. He is following a generally unhealthy diet. When asked about meal planning, he reported none. He has not had a previous visit with a dietitian. He participates in exercise three times a week. Home blood sugar record trend: She brought her log book with him showing recent control of glycemia to near target. An ACE inhibitor/angiotensin II receptor blocker is not being taken. Eye exam is current (Patient has history of retinopathy status post laser therapy.).  Hyperlipidemia  This is a chronic problem. The current episode started more than 1 year ago. The problem is uncontrolled. Recent lipid tests were reviewed and are variable. Exacerbating diseases include diabetes. Pertinent negatives include no chest pain, myalgias or shortness of breath. Current antihyperlipidemic treatment includes statins. Risk factors for coronary artery disease include dyslipidemia, diabetes mellitus, hypertension, male sex and a sedentary lifestyle.  Hypertension  This is a chronic problem. The current episode started more than 1 year ago. The problem is uncontrolled. Pertinent negatives include no chest pain, headaches, neck pain, palpitations or shortness of breath. Risk factors for coronary artery disease include family history, diabetes mellitus, sedentary lifestyle and smoking/tobacco exposure. Past treatments include nothing. Hypertensive end-organ damage includes kidney disease and retinopathy.     Review of Systems  Constitutional: Negative for chills, fatigue, fever and unexpected weight change.  HENT: Negative for dental problem, mouth sores and trouble swallowing.   Eyes: Negative for visual disturbance.  Respiratory: Negative for cough, choking, chest tightness, shortness of breath and wheezing.   Cardiovascular: Negative for chest pain, palpitations and leg  swelling.  Gastrointestinal: Negative for abdominal distention, abdominal pain, constipation, diarrhea, nausea and vomiting.  Endocrine: Negative for polydipsia, polyphagia and polyuria.  Genitourinary: Negative for dysuria, flank pain, hematuria and urgency.  Musculoskeletal: Negative for back pain, gait problem, myalgias and neck pain.  Skin: Negative for pallor, rash and wound.  Neurological: Negative for seizures, syncope, weakness, numbness and headaches.  Psychiatric/Behavioral: Negative.  Negative for confusion and dysphoric mood.    Objective:    BP 133/76   Pulse 76   Ht 5\' 6"  (1.676 m)   Wt 150 lb (68 kg)   BMI 24.21 kg/m   Wt Readings from Last 3 Encounters:  10/28/16 150 lb (68 kg)  09/01/16 140 lb (63.5 kg)  07/23/16 145 lb (65.8 kg)    Physical Exam  Constitutional: He is oriented to person, place, and time. He appears well-developed. He is cooperative. No distress.  HENT:  Head: Normocephalic and atraumatic.  Eyes: EOM are normal.  Neck: Normal range of motion. Neck supple. No tracheal deviation present. No thyromegaly present.  Cardiovascular: Normal rate, S1 normal, S2 normal and normal heart sounds.  Exam reveals no gallop.   No murmur heard. Pulses:      Dorsalis pedis pulses are 1+ on the right side, and 1+ on the left side.       Posterior tibial pulses are 1+ on the right side, and 1+ on the left side.  Pulmonary/Chest: Breath sounds  normal. No respiratory distress. He has no wheezes.  Abdominal: Soft. Bowel sounds are normal. He exhibits no distension. There is no tenderness. There is no guarding and no CVA tenderness.  Musculoskeletal: He exhibits no edema.       Right shoulder: He exhibits no swelling and no deformity.  Neurological: He is alert and oriented to person, place, and time. He has normal strength and normal reflexes. No cranial nerve deficit or sensory deficit. Gait normal.  Skin: Skin is warm and dry. No rash noted. No cyanosis. Nails show  no clubbing.  Psychiatric: He has a normal mood and affect. His speech is normal and behavior is normal. Judgment and thought content normal. Cognition and memory are normal.     Recent Results (from the past 2160 hour(s))  I-STAT, chem 8     Status: Abnormal   Collection Time: 09/01/16  9:29 AM  Result Value Ref Range   Sodium 145 135 - 145 mmol/L   Potassium 4.9 3.5 - 5.1 mmol/L   Chloride 107 101 - 111 mmol/L   BUN 34 (H) 6 - 20 mg/dL   Creatinine, Ser 1.20 0.61 - 1.24 mg/dL   Glucose, Bld 237 (H) 65 - 99 mg/dL   Calcium, Ion 1.24 1.15 - 1.40 mmol/L   TCO2 21 0 - 100 mmol/L   Hemoglobin 14.6 13.0 - 17.0 g/dL   HCT 43.0 39.0 - 52.0 %  Glucose, capillary     Status: Abnormal   Collection Time: 09/01/16  1:45 PM  Result Value Ref Range   Glucose-Capillary 233 (H) 65 - 99 mg/dL   Comment 1 Notify RN   Hemoglobin A1c     Status: None   Collection Time: 10/22/16 12:00 AM  Result Value Ref Range   Hemoglobin A1C 7.6     On 10/22/2016 his CMP showed normal renal function, islet cell antibodies where undetectable, GAD 65 antibody assay pending    Assessment & Plan:   1. Uncontrolled type 2 diabetes mellitus  without long-term current use of insulin ([place])  - Patient has currently uncontrolled symptomatic type 2 DM since  66 years of age. - Came with Higher A1c of 7.6% from 6.8%, although generally improving from 11%. - Patient's recent blood glucose profile on his log book is much better and near target. Recent labs reviewed.   His diabetes is complicated by retinopathy, nephropathy (improving),   and patient remains at a high risk for more acute and chronic complications of diabetes which include CAD, CVA, CKD, retinopathy, and neuropathy. These are all discussed in detail with the patient.  - I have counseled the patient on diet management , by adopting a carbohydrate restricted/protein rich diet.  - Suggestion is made for patient to avoid simple carbohydrates   from their  diet including Cakes , Desserts, Ice Cream,  Soda (  diet and regular) , Sweet Tea , Candies,  Chips, Cookies, Artificial Sweeteners,   and "Sugar-free" Products . This will help patient to have stable blood glucose profile and potentially avoid unintended weight gain.  - I encouraged the patient to switch to  unprocessed or minimally processed complex starch and increased protein intake (animal or plant source), fruits, and vegetables.  - Patient is advised to stick to a routine mealtimes to eat 3 meals  a day and avoid unnecessary snacks ( to snack only to correct hypoglycemia).  - The patient will be scheduled with Jearld Fenton, RDN, CDE for individualized DM education.  - I have  approached patient with the following individualized plan to manage diabetes and patient agrees:   - Per his report he has never been overweight or obese, denies any history of pancreatitis nor heavy alcohol intake. - His presentation is not typical type 2 nor type 1 diabetes. He denies any history of diabetic ketoacidosis.  - I  will proceed to  make modifications on his medications, to maximize his oral medication options.  - I will decrease  Januvia to 50 mg by mouth daily , therapeutically suitable for patient.  - Renal function is back to normal, he will continue to benefit from low-dose metformin. I advised him to continue metformin 500 minute grams by mouth twice a day.  I have discussed side effects and precautions with him.  -  he will have anti-pancreatic studies to classify his diabetes properly- GAD 65 antibody assays pending.  - Patient is not suitable candidate for Victoza, Byetta, nor other injectable incretin therapy. -  patient specific targets for  A1c;  LDL, HDL, Triglycerides, and  Waist Circumference were discussed in detail.  2) BP/HTN: Uncontrolled, and for unclear reasons he was not taking his lisinopril which shows in his oral records. I have advised him to resume lisinopril 20 mg by mouth  daily.  3) Lipids/HPL:  Controlled, LDL at 70, and advised him to continue to be poor 20 mg by mouth daily at bedtime. 4)  Weight/Diet:   his weight is appropriate with BMI of 25.4 , he will benefit CDE Consult, exercise, and detailed carbohydrates information provided.  5) Chronic Care/Health Maintenance:  -Patient is on ACEI/ARB and Statin medications and encouraged to continue to follow up with Ophthalmology, Podiatrist at least yearly or according to recommendations, and advised to  stay away from smoking. I have recommended yearly flu vaccine and pneumonia vaccination at least every 5 years; moderate intensity exercise for up to 150 minutes weekly; and  sleep for at least 7 hours a day.  - 30 minutes of time was spent on the care of this patient , 50% of which was applied for counseling on diabetes complications and their preventions.  - Patient to bring meter and  blood glucose logs during their next visit.   - I advised patient to maintain close follow up with Sharilyn Sites, MD for primary care needs.  Follow up plan: - Return in about 4 months (around 02/28/2017) for follow up with pre-visit labs.  Glade Lloyd, MD Phone: [phone]  Fax: [phone]   10/28/2016, 11:24 AM

## 2016-11-23 NOTE — Addendum Note (Signed)
Addendum  created 11/23/16 1218 by Myrtie Soman, MD   Sign clinical note

## 2017-01-21 ENCOUNTER — Other Ambulatory Visit: Payer: Self-pay

## 2017-01-21 MED ORDER — LISINOPRIL 20 MG PO TABS: ORAL_TABLET | ORAL | 3 refills | Status: DC

## 2017-01-21 MED ORDER — SITAGLIPTIN PHOSPHATE 50 MG PO TABS: 50.0000 mg | ORAL_TABLET | Freq: Every day | ORAL | 3 refills | Status: DC

## 2017-02-03 DIAGNOSIS — Z8042 Family history of malignant neoplasm of prostate: Secondary | ICD-10-CM | POA: Diagnosis not present

## 2017-02-03 DIAGNOSIS — Z6825 Body mass index (BMI) 25.0-25.9, adult: Secondary | ICD-10-CM | POA: Diagnosis not present

## 2017-02-03 DIAGNOSIS — I10 Essential (primary) hypertension: Secondary | ICD-10-CM | POA: Diagnosis not present

## 2017-02-03 DIAGNOSIS — E782 Mixed hyperlipidemia: Secondary | ICD-10-CM | POA: Diagnosis not present

## 2017-02-03 DIAGNOSIS — E1165 Type 2 diabetes mellitus with hyperglycemia: Secondary | ICD-10-CM | POA: Diagnosis not present

## 2017-02-03 DIAGNOSIS — Z0001 Encounter for general adult medical examination with abnormal findings: Secondary | ICD-10-CM | POA: Diagnosis not present

## 2017-02-03 DIAGNOSIS — Z8371 Family history of colonic polyps: Secondary | ICD-10-CM | POA: Diagnosis not present

## 2017-02-10 ENCOUNTER — Ambulatory Visit (INDEPENDENT_AMBULATORY_CARE_PROVIDER_SITE_OTHER): Payer: Medicare HMO | Admitting: Ophthalmology

## 2017-02-10 DIAGNOSIS — E11319 Type 2 diabetes mellitus with unspecified diabetic retinopathy without macular edema: Secondary | ICD-10-CM | POA: Diagnosis not present

## 2017-02-10 DIAGNOSIS — E113393 Type 2 diabetes mellitus with moderate nonproliferative diabetic retinopathy without macular edema, bilateral: Secondary | ICD-10-CM | POA: Diagnosis not present

## 2017-02-10 DIAGNOSIS — I10 Essential (primary) hypertension: Secondary | ICD-10-CM

## 2017-02-10 DIAGNOSIS — H2513 Age-related nuclear cataract, bilateral: Secondary | ICD-10-CM | POA: Diagnosis not present

## 2017-02-10 DIAGNOSIS — H35033 Hypertensive retinopathy, bilateral: Secondary | ICD-10-CM | POA: Diagnosis not present

## 2017-02-10 DIAGNOSIS — D3132 Benign neoplasm of left choroid: Secondary | ICD-10-CM | POA: Diagnosis not present

## 2017-02-10 DIAGNOSIS — H43813 Vitreous degeneration, bilateral: Secondary | ICD-10-CM | POA: Diagnosis not present

## 2017-02-19 ENCOUNTER — Ambulatory Visit (INDEPENDENT_AMBULATORY_CARE_PROVIDER_SITE_OTHER): Payer: Medicare HMO | Admitting: Urology

## 2017-02-19 DIAGNOSIS — R972 Elevated prostate specific antigen [PSA]: Secondary | ICD-10-CM | POA: Diagnosis not present

## 2017-02-19 DIAGNOSIS — R822 Biliuria: Secondary | ICD-10-CM | POA: Diagnosis not present

## 2017-02-19 DIAGNOSIS — N401 Enlarged prostate with lower urinary tract symptoms: Secondary | ICD-10-CM

## 2017-02-23 ENCOUNTER — Other Ambulatory Visit: Payer: Self-pay | Admitting: "Endocrinology

## 2017-02-23 DIAGNOSIS — E1165 Type 2 diabetes mellitus with hyperglycemia: Secondary | ICD-10-CM | POA: Diagnosis not present

## 2017-02-23 DIAGNOSIS — E1122 Type 2 diabetes mellitus with diabetic chronic kidney disease: Secondary | ICD-10-CM | POA: Diagnosis not present

## 2017-02-23 DIAGNOSIS — N183 Chronic kidney disease, stage 3 (moderate): Secondary | ICD-10-CM | POA: Diagnosis not present

## 2017-02-23 LAB — COMPREHENSIVE METABOLIC PANEL
ALT: 19 U/L (ref 9–46)
AST: 17 U/L (ref 10–35)
Albumin: 4.3 g/dL (ref 3.6–5.1)
Alkaline Phosphatase: 64 U/L (ref 40–115)
BILIRUBIN TOTAL: 0.5 mg/dL (ref 0.2–1.2)
BUN: 25 mg/dL (ref 7–25)
CALCIUM: 8.5 mg/dL — AB (ref 8.6–10.3)
CHLORIDE: 110 mmol/L (ref 98–110)
CO2: 23 mmol/L (ref 20–32)
Creat: 1.16 mg/dL (ref 0.70–1.25)
GLUCOSE: 129 mg/dL — AB (ref 65–99)
Potassium: 4.9 mmol/L (ref 3.5–5.3)
SODIUM: 141 mmol/L (ref 135–146)
Total Protein: 6.6 g/dL (ref 6.1–8.1)

## 2017-02-24 LAB — HEMOGLOBIN A1C
Hgb A1c MFr Bld: 7.8 % — ABNORMAL HIGH (ref <5.7)
Mean Plasma Glucose: 177 mg/dL

## 2017-02-25 ENCOUNTER — Other Ambulatory Visit: Payer: Self-pay | Admitting: "Endocrinology

## 2017-03-01 ENCOUNTER — Encounter: Payer: Self-pay | Admitting: "Endocrinology

## 2017-03-01 ENCOUNTER — Ambulatory Visit (INDEPENDENT_AMBULATORY_CARE_PROVIDER_SITE_OTHER): Payer: Medicare HMO | Admitting: "Endocrinology

## 2017-03-01 VITALS — BP 133/74 | HR 71 | Ht 66.0 in | Wt 152.0 lb

## 2017-03-01 DIAGNOSIS — N183 Chronic kidney disease, stage 3 (moderate): Secondary | ICD-10-CM | POA: Diagnosis not present

## 2017-03-01 DIAGNOSIS — I10 Essential (primary) hypertension: Secondary | ICD-10-CM

## 2017-03-01 DIAGNOSIS — E1165 Type 2 diabetes mellitus with hyperglycemia: Secondary | ICD-10-CM

## 2017-03-01 DIAGNOSIS — IMO0002 Reserved for concepts with insufficient information to code with codable children: Secondary | ICD-10-CM

## 2017-03-01 DIAGNOSIS — E782 Mixed hyperlipidemia: Secondary | ICD-10-CM

## 2017-03-01 DIAGNOSIS — E1122 Type 2 diabetes mellitus with diabetic chronic kidney disease: Secondary | ICD-10-CM

## 2017-03-01 NOTE — Progress Notes (Signed)
Subjective:    Patient ID: Eric Guzman, male    DOB: 1951-04-05. Patient is being seen in f/u for management of diabetes requested by  Sharilyn Sites, MD  Past Medical History:  Diagnosis Date  . Anemia   . Diabetes ([place])   . Foley catheter in place 07/21/2016  . Hx of duodenal ulcer 2013  . Hypercholesteremia   . Hypertension    Past Surgical History:  Procedure Laterality Date  . COLONOSCOPY  12/15/2004   RMR:.  Anal papilla.  Otherwise, normal rectum and colon  . COLONOSCOPY  Oct 2011   Dr. Gala Romney: normal  . COLONOSCOPY N/A 04/11/2014   Dr. Gala Romney: Anal papilla and internal hemorroids; otherwise normal ileocolonoscopy  . ESOPHAGOGASTRODUODENOSCOPY N/A 04/11/2014   Dr. Gala Romney: erosive reflux esophagitis.  Gastric ulcer- status post biopsy. duodenal erosions-status post biopsy, negative H.pylori  . ESOPHAGOGASTRODUODENOSCOPY N/A 08/08/2014   Procedure: ESOPHAGOGASTRODUODENOSCOPY (EGD);  Surgeon: Daneil Dolin, MD;  Location: [facility];  Service: Endoscopy;  Laterality: N/A;  1000am  . PROSTATE BIOPSY N/A 09/01/2016   Procedure: BIOPSY TRANSRECTAL ULTRASONIC PROSTATE (TUBP);  Surgeon: Irine Seal, MD;  Location: [facility];  Service: Urology;  Laterality: N/A;  . THULIUM LASER TURP (TRANSURETHRAL RESECTION OF PROSTATE) N/A 09/01/2016   Procedure: THULIUM LASER TURP (TRANSURETHRAL RESECTION OF PROSTATE);  Surgeon: Irine Seal, MD;  Location: [facility];  Service: Urology;  Laterality: N/A;   Social History   Social History  . Marital status: Widowed    Spouse name: N/A  . Number of children: N/A  . Years of education: N/A   Occupational History  . retired     Dealer   Social History Main Topics  . Smoking status: Never Smoker  . Smokeless tobacco: Never Used  . Alcohol use No  . Drug use: No  . Sexual activity: Not Asked   Other Topics Concern  . None   Social History Narrative  . None   Outpatient Encounter Prescriptions as  of 03/01/2017  Medication Sig  . atorvastatin (LIPITOR) 20 MG tablet Take 20 mg by mouth daily.  Marland Kitchen lisinopril (PRINIVIL,ZESTRIL) 20 MG tablet TAKE ONE (1) TABLET EACH DAY  . metFORMIN (GLUCOPHAGE) 500 MG tablet TAKE ONE TABLET TWICE DAILY WITH A MEAL  . Multiple Vitamin (MULTIVITAMIN) tablet Take 1 tablet by mouth daily. WITH IRON  . pantoprazole (PROTONIX) 40 MG tablet TAKE ONE (1) TABLET BY MOUTH EVERY DAY  . sitaGLIPtin (JANUVIA) 50 MG tablet Take 1 tablet (50 mg total) by mouth daily.  . tamsulosin (FLOMAX) 0.4 MG CAPS capsule Take 0.4 mg by mouth daily.  . traMADol-acetaminophen (ULTRACET) 37.5-325 MG tablet Take 1 tablet by mouth every 6 (six) hours as needed.  . [DISCONTINUED] metFORMIN (GLUCOPHAGE) 500 MG tablet TAKE ONE TABLET TWICE DAILY WITH MEALS   No facility-administered encounter medications on file as of 03/01/2017.    ALLERGIES: No Known Allergies VACCINATION STATUS:  There is no immunization history on file for this patient.  Diabetes  He presents for his follow-up diabetic visit. He has type 2 diabetes mellitus. Onset time: He was diagnosed at approximate age of 66 years. His disease course has been stable. There are no hypoglycemic associated symptoms. Pertinent negatives for hypoglycemia include no confusion, headaches, pallor or seizures. There are no diabetic associated symptoms. Pertinent negatives for diabetes include no chest pain, no fatigue, no polydipsia, no polyphagia, no polyuria and no weakness. There are no hypoglycemic complications. Symptoms are stable. Diabetic  complications include nephropathy and retinopathy. Risk factors for coronary artery disease include diabetes mellitus, dyslipidemia, hypertension, male sex and tobacco exposure. Current diabetic treatment includes oral agent (dual therapy) (He is taking Januvia 100 mg by mouth twice a day and glimepiride 4 mg by mouth twice a day). His weight is stable. He is following a generally unhealthy diet. When  asked about meal planning, he reported none. He has not had a previous visit with a dietitian. He participates in exercise three times a week. Home blood sugar record trend: She brought her log book with him showing recent control of glycemia to near target. An ACE inhibitor/angiotensin II receptor blocker is not being taken. Eye exam is current (Patient has history of retinopathy status post laser therapy.).  Hyperlipidemia  This is a chronic problem. The current episode started more than 1 year ago. The problem is uncontrolled. Recent lipid tests were reviewed and are variable. Exacerbating diseases include diabetes. Pertinent negatives include no chest pain, myalgias or shortness of breath. Current antihyperlipidemic treatment includes statins. Risk factors for coronary artery disease include dyslipidemia, diabetes mellitus, hypertension, male sex and a sedentary lifestyle.  Hypertension  This is a chronic problem. The current episode started more than 1 year ago. The problem is uncontrolled. Pertinent negatives include no chest pain, headaches, neck pain, palpitations or shortness of breath. Risk factors for coronary artery disease include family history, diabetes mellitus, sedentary lifestyle and smoking/tobacco exposure. Past treatments include nothing. Hypertensive end-organ damage includes kidney disease and retinopathy.     Review of Systems  Constitutional: Negative for chills, fatigue, fever and unexpected weight change.  HENT: Negative for dental problem, mouth sores and trouble swallowing.   Eyes: Negative for visual disturbance.  Respiratory: Negative for cough, choking, chest tightness, shortness of breath and wheezing.   Cardiovascular: Negative for chest pain, palpitations and leg swelling.  Gastrointestinal: Negative for abdominal distention, abdominal pain, constipation, diarrhea, nausea and vomiting.  Endocrine: Negative for polydipsia, polyphagia and polyuria.  Genitourinary:  Negative for dysuria, flank pain, hematuria and urgency.  Musculoskeletal: Negative for back pain, gait problem, myalgias and neck pain.  Skin: Negative for pallor, rash and wound.  Neurological: Negative for seizures, syncope, weakness, numbness and headaches.  Psychiatric/Behavioral: Negative.  Negative for confusion and dysphoric mood.    Objective:    BP 133/74   Pulse 71   Ht 5\' 6"  (1.676 m)   Wt 152 lb (68.9 kg)   BMI 24.53 kg/m   Wt Readings from Last 3 Encounters:  03/01/17 152 lb (68.9 kg)  10/28/16 150 lb (68 kg)  09/01/16 140 lb (63.5 kg)    Physical Exam  Constitutional: He is oriented to person, place, and time. He appears well-developed. He is cooperative. No distress.  HENT:  Head: Normocephalic and atraumatic.  Eyes: EOM are normal.  Neck: Normal range of motion. Neck supple. No tracheal deviation present. No thyromegaly present.  Cardiovascular: Normal rate, S1 normal, S2 normal and normal heart sounds.  Exam reveals no gallop.   No murmur heard. Pulses:      Dorsalis pedis pulses are 1+ on the right side, and 1+ on the left side.       Posterior tibial pulses are 1+ on the right side, and 1+ on the left side.  Pulmonary/Chest: Breath sounds normal. No respiratory distress. He has no wheezes.  Abdominal: Soft. Bowel sounds are normal. He exhibits no distension. There is no tenderness. There is no guarding and no CVA tenderness.  Musculoskeletal:  He exhibits no edema.       Right shoulder: He exhibits no swelling and no deformity.  Neurological: He is alert and oriented to person, place, and time. He has normal strength and normal reflexes. No cranial nerve deficit or sensory deficit. Gait normal.  Skin: Skin is warm and dry. No rash noted. No cyanosis. Nails show no clubbing.  Psychiatric: He has a normal mood and affect. His speech is normal and behavior is normal. Judgment and thought content normal. Cognition and memory are normal.     Recent Results (from  the past 2160 hour(s))  Comprehensive metabolic panel     Status: Abnormal   Collection Time: 02/23/17  8:44 AM  Result Value Ref Range   Sodium 141 135 - 146 mmol/L   Potassium 4.9 3.5 - 5.3 mmol/L   Chloride 110 98 - 110 mmol/L   CO2 23 20 - 32 mmol/L    Comment: ** Please note change in reference range(s). **      Glucose, Bld 129 (H) 65 - 99 mg/dL   BUN 25 7 - 25 mg/dL   Creat 1.16 0.70 - 1.25 mg/dL    Comment:   For patients > or = 68 years of age: The upper reference limit for Creatinine is approximately 13% higher for people identified as African-American.      Total Bilirubin 0.5 0.2 - 1.2 mg/dL   Alkaline Phosphatase 64 40 - 115 U/L   AST 17 10 - 35 U/L   ALT 19 9 - 46 U/L   Total Protein 6.6 6.1 - 8.1 g/dL   Albumin 4.3 3.6 - 5.1 g/dL   Calcium 8.5 (L) 8.6 - 10.3 mg/dL  Hemoglobin A1c     Status: Abnormal   Collection Time: 02/23/17  8:44 AM  Result Value Ref Range   Hgb A1c MFr Bld 7.8 (H) <5.7 %    Comment:   For someone without known diabetes, a hemoglobin A1c value of 6.5% or greater indicates that they may have diabetes and this should be confirmed with a follow-up test.   For someone with known diabetes, a value <7% indicates that their diabetes is well controlled and a value greater than or equal to 7% indicates suboptimal control. A1c targets should be individualized based on duration of diabetes, age, comorbid conditions, and other considerations.   Currently, no consensus exists for use of hemoglobin A1c for diagnosis of diabetes for children.      Mean Plasma Glucose 177 mg/dL    On 10/22/2016 his CMP showed normal renal function, islet cell antibodies where undetectable, GAD 65 antibody assay pending    Assessment & Plan:   1. Uncontrolled type 2 diabetes mellitus  without long-term current use of insulin ([place])  - Patient has currently uncontrolled symptomatic type 2 DM since  66 years of age. - Came with Stable  A1c of 7.8%,  generally  improving from 11%. - Patient's recent blood glucose profile on his log book is much better and near target. Recent labs reviewed.   His diabetes is complicated by retinopathy, nephropathy (improving),   and patient remains at a high risk for more acute and chronic complications of diabetes which include CAD, CVA, CKD, retinopathy, and neuropathy. These are all discussed in detail with the patient.  - I have counseled the patient on diet management , by adopting a carbohydrate restricted/protein rich diet.  - Suggestion is made for him to avoid simple carbohydrates  from his diet including Cakes,  Sweet Desserts, Ice Cream, Soda (diet and regular), Sweet Tea, Candies, Chips, Cookies, Store Bought Juices, Alcohol in Excess of  1-2 drinks a day, Artificial Sweeteners, and "Sugar-free" Products. This will help patient to have stable blood glucose profile and potentially avoid unintended weight gain.   - I encouraged the patient to switch to  unprocessed or minimally processed complex starch and increased protein intake (animal or plant source), fruits, and vegetables.  - Patient is advised to stick to a routine mealtimes to eat 3 meals  a day and avoid unnecessary snacks ( to snack only to correct hypoglycemia).    - I have approached patient with the following individualized plan to manage diabetes and patient agrees:   - Per his report he has never been overweight or obese, denies any history of pancreatitis nor heavy alcohol intake. - His presentation is not typical type 2 nor type 1 diabetes. He denies any history of diabetic ketoacidosis. His GAD 65 antibodies and anti-islet cell antibodies aree undetectable - suggesting the likelihood of type 2 diabetes rather than type I.   - I will continue  Januvia  50 mg by mouth daily , therapeutically suitable for patient.  - Renal function is  normal, he will continue to benefit from  metformin. I advised him to increase his after supper metformin to  1000 mg , continue metformin 500 mg daily after breakfast as well as.    I have discussed side effects and precautions with him.   - Patient is not suitable candidate for Victoza, Byetta, nor other injectable incretin therapy. -  patient specific targets for  A1c;  LDL, HDL, Triglycerides, and  Waist Circumference were discussed in detail.  2) BP/HTN: controlled.  I have advised him to continue  lisinopril 20 mg by mouth daily.  3) Lipids/HPL:  Controlled, LDL at 70, and advised him to continue to be poor 20 mg by mouth daily at bedtime. 4)  Weight/Diet:   his weight is appropriate with BMI of 25.4 , he will benefit CDE Consult, exercise, and detailed carbohydrates information provided.  5) Chronic Care/Health Maintenance:  -Patient is on ACEI/ARB and Statin medications and encouraged to continue to follow up with Ophthalmology, Podiatrist at least yearly or according to recommendations, and advised to  stay away from smoking. I have recommended yearly flu vaccine and pneumonia vaccination at least every 5 years; moderate intensity exercise for up to 150 minutes weekly; and  sleep for at least 7 hours a day.   - I advised patient to maintain close follow up with Sharilyn Sites, MD for primary care needs.  Follow up plan: - Return in about 3 months (around 05/31/2017) for follow up with pre-visit labs.  Glade Lloyd, MD Phone: [phone]  Fax: [phone]  This note was partially dictated with voice recognition software. Similar sounding words can be transcribed inadequately or may not  be corrected upon review.  03/01/2017, 11:28 AM

## 2017-03-01 NOTE — Patient Instructions (Signed)

## 2017-03-23 ENCOUNTER — Other Ambulatory Visit: Payer: Self-pay

## 2017-03-23 MED ORDER — METFORMIN HCL 500 MG PO TABS: ORAL_TABLET | ORAL | 0 refills | Status: DC

## 2017-03-25 ENCOUNTER — Other Ambulatory Visit: Payer: Self-pay

## 2017-03-25 ENCOUNTER — Other Ambulatory Visit: Payer: Self-pay | Admitting: Nurse Practitioner

## 2017-03-29 MED ORDER — PANTOPRAZOLE SODIUM 40 MG PO TBEC: 40.0000 mg | DELAYED_RELEASE_TABLET | Freq: Every day | ORAL | 0 refills | Status: AC

## 2017-03-29 NOTE — Telephone Encounter (Signed)
Called. Many rings and no answer. Will mail a letter to call and schedule an appt before further refills.

## 2017-03-29 NOTE — Telephone Encounter (Signed)
Please tell the patient I will send in a limited refill supply however we have not seen him in over 2 years and he will need a follow-up visit for further refills.

## 2017-04-20 ENCOUNTER — Other Ambulatory Visit: Payer: Self-pay | Admitting: "Endocrinology

## 2017-05-07 DIAGNOSIS — Z6825 Body mass index (BMI) 25.0-25.9, adult: Secondary | ICD-10-CM | POA: Diagnosis not present

## 2017-05-07 DIAGNOSIS — E782 Mixed hyperlipidemia: Secondary | ICD-10-CM | POA: Diagnosis not present

## 2017-05-07 DIAGNOSIS — E663 Overweight: Secondary | ICD-10-CM | POA: Diagnosis not present

## 2017-05-18 ENCOUNTER — Other Ambulatory Visit: Payer: Self-pay | Admitting: "Endocrinology

## 2017-05-26 DIAGNOSIS — E782 Mixed hyperlipidemia: Secondary | ICD-10-CM | POA: Diagnosis not present

## 2017-05-26 DIAGNOSIS — N183 Chronic kidney disease, stage 3 (moderate): Secondary | ICD-10-CM | POA: Diagnosis not present

## 2017-05-26 DIAGNOSIS — E1122 Type 2 diabetes mellitus with diabetic chronic kidney disease: Secondary | ICD-10-CM | POA: Diagnosis not present

## 2017-05-26 DIAGNOSIS — E1165 Type 2 diabetes mellitus with hyperglycemia: Secondary | ICD-10-CM | POA: Diagnosis not present

## 2017-05-27 LAB — RENAL FUNCTION PANEL
Albumin: 3.9 g/dL (ref 3.6–5.1)
BUN: 22 mg/dL (ref 7–25)
CHLORIDE: 107 mmol/L (ref 98–110)
CO2: 27 mmol/L (ref 20–32)
Calcium: 8.9 mg/dL (ref 8.6–10.3)
Creat: 1.16 mg/dL (ref 0.70–1.25)
GLUCOSE: 205 mg/dL — AB (ref 65–99)
PHOSPHORUS: 2.7 mg/dL (ref 2.1–4.3)
Potassium: 4.6 mmol/L (ref 3.5–5.3)
Sodium: 141 mmol/L (ref 135–146)

## 2017-05-27 LAB — MICROALBUMIN / CREATININE URINE RATIO
CREATININE, URINE: 115 mg/dL (ref 20–320)
MICROALB UR: 7.2 mg/dL
MICROALB/CREAT RATIO: 63 ug/mg{creat} — AB (ref <30)

## 2017-05-27 LAB — TSH: TSH: 0.95 mIU/L (ref 0.40–4.50)

## 2017-05-27 LAB — VITAMIN D 25 HYDROXY (VIT D DEFICIENCY, FRACTURES): Vit D, 25-Hydroxy: 29 ng/mL — ABNORMAL LOW (ref 30–100)

## 2017-05-27 LAB — HEMOGLOBIN A1C
Hgb A1c MFr Bld: 7.8 % of total Hgb — ABNORMAL HIGH (ref <5.7)
MEAN PLASMA GLUCOSE: 177 (calc)
eAG (mmol/L): 9.8 (calc)

## 2017-05-27 LAB — T4, FREE: FREE T4: 1.1 ng/dL (ref 0.8–1.8)

## 2017-05-27 LAB — LIPID PANEL
CHOLESTEROL: 115 mg/dL (ref <200)
HDL: 43 mg/dL (ref >40)
LDL Cholesterol (Calc): 58 mg/dL (calc)
Non-HDL Cholesterol (Calc): 72 mg/dL (calc) (ref <130)
Total CHOL/HDL Ratio: 2.7 (calc) (ref <5.0)
Triglycerides: 62 mg/dL (ref <150)

## 2017-06-02 ENCOUNTER — Ambulatory Visit: Payer: Medicare HMO | Admitting: "Endocrinology

## 2017-06-02 ENCOUNTER — Encounter: Payer: Self-pay | Admitting: "Endocrinology

## 2017-06-02 VITALS — BP 133/75 | HR 89 | Ht 66.0 in | Wt 158.0 lb

## 2017-06-02 DIAGNOSIS — E782 Mixed hyperlipidemia: Secondary | ICD-10-CM | POA: Diagnosis not present

## 2017-06-02 DIAGNOSIS — E1122 Type 2 diabetes mellitus with diabetic chronic kidney disease: Secondary | ICD-10-CM | POA: Diagnosis not present

## 2017-06-02 DIAGNOSIS — E1165 Type 2 diabetes mellitus with hyperglycemia: Secondary | ICD-10-CM

## 2017-06-02 DIAGNOSIS — IMO0002 Reserved for concepts with insufficient information to code with codable children: Secondary | ICD-10-CM

## 2017-06-02 DIAGNOSIS — N183 Chronic kidney disease, stage 3 (moderate): Secondary | ICD-10-CM

## 2017-06-02 DIAGNOSIS — E119 Type 2 diabetes mellitus without complications: Secondary | ICD-10-CM | POA: Diagnosis not present

## 2017-06-02 DIAGNOSIS — I10 Essential (primary) hypertension: Secondary | ICD-10-CM

## 2017-06-02 MED ORDER — SITAGLIPTIN PHOSPHATE 50 MG PO TABS: ORAL_TABLET | ORAL | 1 refills | Status: DC

## 2017-06-02 MED ORDER — METFORMIN HCL 500 MG PO TABS: ORAL_TABLET | ORAL | 0 refills | Status: DC

## 2017-06-02 NOTE — Progress Notes (Signed)
Subjective:    Patient ID: Eric Guzman, male    DOB: 02-Aug-1950. Patient is being seen in f/u for management of diabetes requested by  Sharilyn Sites, MD  Past Medical History:  Diagnosis Date  . Anemia   . Diabetes ([place])   . Foley catheter in place 07/21/2016  . Hx of duodenal ulcer 2013  . Hypercholesteremia   . Hypertension    Past Surgical History:  Procedure Laterality Date  . COLONOSCOPY  12/15/2004   RMR:.  Anal papilla.  Otherwise, normal rectum and colon  . COLONOSCOPY  Oct 2011   Dr. Gala Romney: normal  . COLONOSCOPY N/A 04/11/2014   Dr. Gala Romney: Anal papilla and internal hemorroids; otherwise normal ileocolonoscopy  . ESOPHAGOGASTRODUODENOSCOPY N/A 04/11/2014   Dr. Gala Romney: erosive reflux esophagitis.  Gastric ulcer- status post biopsy. duodenal erosions-status post biopsy, negative H.pylori  . ESOPHAGOGASTRODUODENOSCOPY N/A 08/08/2014   Procedure: ESOPHAGOGASTRODUODENOSCOPY (EGD);  Surgeon: Daneil Dolin, MD;  Location: [facility];  Service: Endoscopy;  Laterality: N/A;  1000am  . PROSTATE BIOPSY N/A 09/01/2016   Procedure: BIOPSY TRANSRECTAL ULTRASONIC PROSTATE (TUBP);  Surgeon: Irine Seal, MD;  Location: [facility];  Service: Urology;  Laterality: N/A;  . THULIUM LASER TURP (TRANSURETHRAL RESECTION OF PROSTATE) N/A 09/01/2016   Procedure: THULIUM LASER TURP (TRANSURETHRAL RESECTION OF PROSTATE);  Surgeon: Irine Seal, MD;  Location: [facility];  Service: Urology;  Laterality: N/A;   Social History   Socioeconomic History  . Marital status: Widowed    Spouse name: None  . Number of children: None  . Years of education: None  . Highest education level: None  Social Needs  . Financial resource strain: None  . Food insecurity - worry: None  . Food insecurity - inability: None  . Transportation needs - medical: None  . Transportation needs - non-medical: None  Occupational History  . Occupation: retired    Comment: Health visitor  . Smoking status: Never Smoker  . Smokeless tobacco: Never Used  Substance and Sexual Activity  . Alcohol use: No    Alcohol/week: 0.0 oz  . Drug use: No  . Sexual activity: None  Other Topics Concern  . None  Social History Narrative  . None   Outpatient Encounter Medications as of 06/02/2017  Medication Sig  . atorvastatin (LIPITOR) 20 MG tablet Take 20 mg by mouth daily.  Marland Kitchen lisinopril (PRINIVIL,ZESTRIL) 20 MG tablet TAKE ONE (1) TABLET EACH DAY  . metFORMIN (GLUCOPHAGE) 500 MG tablet 1000mg  in thea.m and 1000mg  at night.  . Multiple Vitamin (MULTIVITAMIN) tablet Take 1 tablet by mouth daily. WITH IRON  . pantoprazole (PROTONIX) 40 MG tablet Take 1 tablet (40 mg total) by mouth daily.  . sitaGLIPtin (JANUVIA) 50 MG tablet TAKE ONE (1) TABLET BY MOUTH EVERY DAY  . [DISCONTINUED] JANUVIA 50 MG tablet TAKE ONE (1) TABLET BY MOUTH EVERY DAY  . [DISCONTINUED] metFORMIN (GLUCOPHAGE) 500 MG tablet 500mg  in thea.m and 1000mg  at night.  . [DISCONTINUED] pantoprazole (PROTONIX) 40 MG tablet TAKE ONE (1) TABLET BY MOUTH EVERY DAY  . [DISCONTINUED] sitaGLIPtin (JANUVIA) 50 MG tablet Take 1 tablet (50 mg total) by mouth daily.  . [DISCONTINUED] tamsulosin (FLOMAX) 0.4 MG CAPS capsule Take 0.4 mg by mouth daily.  . [DISCONTINUED] traMADol-acetaminophen (ULTRACET) 37.5-325 MG tablet Take 1 tablet by mouth every 6 (six) hours as needed.   No facility-administered encounter medications on file as of 06/02/2017.    ALLERGIES: No Known Allergies  VACCINATION STATUS:  There is no immunization history on file for this patient.  Diabetes  He presents for his follow-up diabetic visit. He has type 2 diabetes mellitus. Onset time: He was diagnosed at approximate age of 29 years. His disease course has been stable. There are no hypoglycemic associated symptoms. Pertinent negatives for hypoglycemia include no confusion, headaches, pallor or seizures. There are no diabetic associated  symptoms. Pertinent negatives for diabetes include no chest pain, no fatigue, no polydipsia, no polyphagia, no polyuria and no weakness. There are no hypoglycemic complications. Symptoms are stable. Diabetic complications include nephropathy and retinopathy. Risk factors for coronary artery disease include diabetes mellitus, dyslipidemia, hypertension, male sex and tobacco exposure. Current diabetic treatment includes oral agent (dual therapy) (He is taking Januvia 100 mg by mouth twice a day and glimepiride 4 mg by mouth twice a day). His weight is increasing steadily. He is following a generally unhealthy diet. When asked about meal planning, he reported none. He has not had a previous visit with a dietitian. He participates in exercise three times a week. An ACE inhibitor/angiotensin II receptor blocker is not being taken. Eye exam is current (Patient has history of retinopathy status post laser therapy.).  Hyperlipidemia  This is a chronic problem. The current episode started more than 1 year ago. The problem is uncontrolled. Recent lipid tests were reviewed and are variable. Exacerbating diseases include diabetes. Pertinent negatives include no chest pain, myalgias or shortness of breath. Current antihyperlipidemic treatment includes statins. Risk factors for coronary artery disease include dyslipidemia, diabetes mellitus, hypertension, male sex and a sedentary lifestyle.  Hypertension  This is a chronic problem. The current episode started more than 1 year ago. The problem is uncontrolled. Pertinent negatives include no chest pain, headaches, neck pain, palpitations or shortness of breath. Risk factors for coronary artery disease include family history, diabetes mellitus, sedentary lifestyle and smoking/tobacco exposure. Past treatments include nothing. Hypertensive end-organ damage includes kidney disease and retinopathy.     Review of Systems  Constitutional: Negative for chills, fatigue, fever and  unexpected weight change.  HENT: Negative for dental problem, mouth sores and trouble swallowing.   Eyes: Negative for visual disturbance.  Respiratory: Negative for cough, choking, chest tightness, shortness of breath and wheezing.   Cardiovascular: Negative for chest pain, palpitations and leg swelling.  Gastrointestinal: Negative for abdominal distention, abdominal pain, constipation, diarrhea, nausea and vomiting.  Endocrine: Negative for polydipsia, polyphagia and polyuria.  Genitourinary: Negative for dysuria, flank pain, hematuria and urgency.  Musculoskeletal: Negative for back pain, gait problem, myalgias and neck pain.  Skin: Negative for pallor, rash and wound.  Neurological: Negative for seizures, syncope, weakness, numbness and headaches.  Psychiatric/Behavioral: Negative.  Negative for confusion and dysphoric mood.    Objective:    BP 133/75   Pulse 89   Ht 5\' 6"  (1.676 m)   Wt 158 lb (71.7 kg)   BMI 25.50 kg/m   Wt Readings from Last 3 Encounters:  06/02/17 158 lb (71.7 kg)  03/01/17 152 lb (68.9 kg)  10/28/16 150 lb (68 kg)    Physical Exam  Constitutional: He is oriented to person, place, and time. He appears well-developed. He is cooperative. No distress.  HENT:  Head: Normocephalic and atraumatic.  Eyes: EOM are normal.  Neck: Normal range of motion. Neck supple. No tracheal deviation present. No thyromegaly present.  Cardiovascular: Normal rate, S1 normal, S2 normal and normal heart sounds. Exam reveals no gallop.  No murmur heard. Pulses:  Dorsalis pedis pulses are 1+ on the right side, and 1+ on the left side.       Posterior tibial pulses are 1+ on the right side, and 1+ on the left side.  Pulmonary/Chest: Breath sounds normal. No respiratory distress. He has no wheezes.  Abdominal: Soft. Bowel sounds are normal. He exhibits no distension. There is no tenderness. There is no guarding and no CVA tenderness.  Musculoskeletal: He exhibits no edema.        Right shoulder: He exhibits no swelling and no deformity.  Neurological: He is alert and oriented to person, place, and time. He has normal strength and normal reflexes. No cranial nerve deficit or sensory deficit. Gait normal.  Skin: Skin is warm and dry. No rash noted. No cyanosis. Nails show no clubbing.  Psychiatric: He has a normal mood and affect. His speech is normal and behavior is normal. Judgment and thought content normal. Cognition and memory are normal.     Recent Results (from the past 2160 hour(s))  Renal function panel     Status: Abnormal   Collection Time: 05/26/17  8:32 AM  Result Value Ref Range   Glucose, Bld 205 (H) 65 - 99 mg/dL    Comment: .            Fasting reference interval . For someone without known diabetes, a glucose value >125 mg/dL indicates that they may have diabetes and this should be confirmed with a follow-up test. .    BUN 22 7 - 25 mg/dL   Creat 1.16 0.70 - 1.25 mg/dL    Comment: For patients >93 years of age, the reference limit for Creatinine is approximately 13% higher for people identified as African-American. .    BUN/Creatinine Ratio NOT APPLICABLE 6 - 22 (calc)   Sodium 141 135 - 146 mmol/L   Potassium 4.6 3.5 - 5.3 mmol/L   Chloride 107 98 - 110 mmol/L   CO2 27 20 - 32 mmol/L   Calcium 8.9 8.6 - 10.3 mg/dL   Phosphorus 2.7 2.1 - 4.3 mg/dL   Albumin 3.9 3.6 - 5.1 g/dL  Hemoglobin A1c     Status: Abnormal   Collection Time: 05/26/17  8:32 AM  Result Value Ref Range   Hgb A1c MFr Bld 7.8 (H) <5.7 % of total Hgb    Comment: For someone without known diabetes, a hemoglobin A1c value of 6.5% or greater indicates that they may have  diabetes and this should be confirmed with a follow-up  test. . For someone with known diabetes, a value <7% indicates  that their diabetes is well controlled and a value  greater than or equal to 7% indicates suboptimal  control. A1c targets should be individualized based on  duration of  diabetes, age, comorbid conditions, and  other considerations. . Currently, no consensus exists regarding use of hemoglobin A1c for diagnosis of diabetes for children. .    Mean Plasma Glucose 177 (calc)   eAG (mmol/L) 9.8 (calc)  Lipid panel     Status: None   Collection Time: 05/26/17  8:32 AM  Result Value Ref Range   Cholesterol 115 <200 mg/dL   HDL 43 >40 mg/dL   Triglycerides 62 <150 mg/dL   LDL Cholesterol (Calc) 58 mg/dL (calc)    Comment: Reference range: <100 . Desirable range <100 mg/dL for primary prevention;   <70 mg/dL for patients with CHD or diabetic patients  with > or = 2 CHD risk factors. Marland Kitchen LDL-C is now  calculated using the Martin-Hopkins  calculation, which is a validated novel method providing  better accuracy than the Friedewald equation in the  estimation of LDL-C.  Cresenciano Genre et al. Annamaria Helling. 2536;644(03): 2061-2068  (http://education.[facility].com/faq/FAQ164)    Total CHOL/HDL Ratio 2.7 <5.0 (calc)   Non-HDL Cholesterol (Calc) 72 <130 mg/dL (calc)    Comment: For patients with diabetes plus 1 major ASCVD risk  factor, treating to a non-HDL-C goal of <100 mg/dL  (LDL-C of <70 mg/dL) is considered a therapeutic  option.   TSH     Status: None   Collection Time: 05/26/17  8:32 AM  Result Value Ref Range   TSH 0.95 0.40 - 4.50 mIU/L  T4, free     Status: None   Collection Time: 05/26/17  8:32 AM  Result Value Ref Range   Free T4 1.1 0.8 - 1.8 ng/dL  Microalbumin / creatinine urine ratio     Status: Abnormal   Collection Time: 05/26/17  8:32 AM  Result Value Ref Range   Creatinine, Urine 115 20 - 320 mg/dL   Microalb, Ur 7.2 mg/dL    Comment: Reference Range Not established    Microalb Creat Ratio 63 (H) <30 mcg/mg creat    Comment: . The ADA defines abnormalities in albumin excretion as follows: Marland Kitchen Category         Result (mcg/mg creatinine) . Normal                    <30 Microalbuminuria         30-299  Clinical albuminuria   > OR =  300 . The ADA recommends that at least two of three specimens collected within a 3-6 month period be abnormal before considering a patient to be within a diagnostic category.   VITAMIN D 25 Hydroxy (Vit-D Deficiency, Fractures)     Status: Abnormal   Collection Time: 05/26/17  8:32 AM  Result Value Ref Range   Vit D, 25-Hydroxy 29 (L) 30 - 100 ng/mL    Comment: Vitamin D Status         25-OH Vitamin D: . Deficiency:                    <20 ng/mL Insufficiency:             20 - 29 ng/mL Optimal:                 > or = 30 ng/mL . For 25-OH Vitamin D testing on patients on  D2-supplementation and patients for whom quantitation  of D2 and D3 fractions is required, the QuestAssureD(TM) 25-OH VIT D, (D2,D3), LC/MS/MS is recommended: order  code [phone] (patients >59yrs). . For more information on this test, go to: http://education.[facility].com/faq/FAQ163 (This link is being provided for  informational/educational purposes only.)     On 10/22/2016 his CMP showed normal renal function, islet cell antibodies where undetectable, GAD 65 antibody is also negative.    Assessment & Plan:   1. Uncontrolled type 2 diabetes mellitus  without long-term current use of insulin ([place])  - Patient has currently uncontrolled symptomatic type 2 DM since  66 years of age. - Came with Stable  A1c of 7.8%,  generally improving from 11%. - Recent labs reviewed.   His diabetes is complicated by retinopathy, nephropathy (improving),   and patient remains at a high risk for more acute and chronic complications of diabetes which include CAD, CVA, CKD, retinopathy, and neuropathy. These are all  discussed in detail with the patient.  - I have counseled the patient on diet management , by adopting a carbohydrate restricted/protein rich diet.  -  Suggestion is made for him to avoid simple carbohydrates  from his diet including Cakes, Sweet Desserts / Pastries, Ice Cream, Soda (diet and regular), Sweet  Tea, Candies, Chips, Cookies, Store Bought Juices, Alcohol in Excess of  1-2 drinks a day, Artificial Sweeteners, and "Sugar-free" Products. This will help patient to have stable blood glucose profile and potentially avoid unintended weight gain.   - I encouraged the patient to switch to  unprocessed or minimally processed complex starch and increased protein intake (animal or plant source), fruits, and vegetables.  - Patient is advised to stick to a routine mealtimes to eat 3 meals  a day and avoid unnecessary snacks ( to snack only to correct hypoglycemia).    - I have approached patient with the following individualized plan to manage diabetes and patient agrees:   - Per his report he has never been overweight or obese, denies any history of pancreatitis nor heavy alcohol intake. - His presentation is not typical type 2 nor type 1 diabetes. He denies any history of diabetic ketoacidosis. His GAD 65 antibodies and anti-islet cell antibodies aree undetectable - suggesting the likelihood of type 2 diabetes rather than type I.   - I will continue  Januvia  50 mg by mouth daily , therapeutically suitable for patient.  - Renal function is  normal, he will continue to benefit from  metformin. I advised him to increase his after supper metformin to 1000 mg by mouth twice a day after breakfast and after supper.    I have discussed side effects and precautions with him.   - Patient is not suitable candidate for Victoza, Byetta, nor other injectable incretin therapy. -  patient specific targets for  A1c;  LDL, HDL, Triglycerides, and  Waist Circumference were discussed in detail.  2) BP/HTN: controlled.  I have advised him to continue  lisinopril 20 mg by mouth daily.  3) Lipids/HPL:  Controlled, LDL at 58, and advised him to continue Lipitor  20 mg by mouth daily at bedtime. 4)  Weight/Diet: he will benefit from continued CDE Consult, exercise, and detailed carbohydrates information  provided.  5) Chronic Care/Health Maintenance:  -Patient is on ACEI/ARB and Statin medications and encouraged to continue to follow up with Ophthalmology, Podiatrist at least yearly or according to recommendations, and advised to  stay away from smoking. I have recommended yearly flu vaccine and pneumonia vaccination at least every 5 years; moderate intensity exercise for up to 150 minutes weekly; and  sleep for at least 7 hours a day.   - I advised patient to maintain close follow up with Sharilyn Sites, MD for primary care needs.  - Time spent with the patient: 25 min, of which >50% was spent in reviewing his sugar logs , discussing his hypo- and hyper-glycemic episodes, reviewing his current and  previous labs and insulin doses and developing a plan to avoid hypo- and hyper-glycemia.   Follow up plan: - Return in about 6 months (around 12/01/2017) for follow up with pre-visit labs.  Glade Lloyd, MD Phone: [phone]  Fax: [phone]  This note was partially dictated with voice recognition software. Similar sounding words can be transcribed inadequately or may not  be corrected upon review.  06/02/2017, 10:24 AM

## 2017-06-02 NOTE — Patient Instructions (Signed)

## 2017-07-21 ENCOUNTER — Other Ambulatory Visit: Payer: Self-pay | Admitting: "Endocrinology

## 2017-07-26 ENCOUNTER — Other Ambulatory Visit: Payer: Self-pay | Admitting: "Endocrinology

## 2017-08-16 ENCOUNTER — Other Ambulatory Visit: Payer: Self-pay | Admitting: "Endocrinology

## 2017-08-16 ENCOUNTER — Encounter (INDEPENDENT_AMBULATORY_CARE_PROVIDER_SITE_OTHER): Payer: Medicare HMO | Admitting: Ophthalmology

## 2017-08-16 DIAGNOSIS — E113312 Type 2 diabetes mellitus with moderate nonproliferative diabetic retinopathy with macular edema, left eye: Secondary | ICD-10-CM

## 2017-08-16 DIAGNOSIS — I10 Essential (primary) hypertension: Secondary | ICD-10-CM

## 2017-08-16 DIAGNOSIS — H43813 Vitreous degeneration, bilateral: Secondary | ICD-10-CM | POA: Diagnosis not present

## 2017-08-16 DIAGNOSIS — D3132 Benign neoplasm of left choroid: Secondary | ICD-10-CM | POA: Diagnosis not present

## 2017-08-16 DIAGNOSIS — H2513 Age-related nuclear cataract, bilateral: Secondary | ICD-10-CM | POA: Diagnosis not present

## 2017-08-16 DIAGNOSIS — H35033 Hypertensive retinopathy, bilateral: Secondary | ICD-10-CM | POA: Diagnosis not present

## 2017-08-16 DIAGNOSIS — E11311 Type 2 diabetes mellitus with unspecified diabetic retinopathy with macular edema: Secondary | ICD-10-CM | POA: Diagnosis not present

## 2017-08-16 DIAGNOSIS — E113391 Type 2 diabetes mellitus with moderate nonproliferative diabetic retinopathy without macular edema, right eye: Secondary | ICD-10-CM | POA: Diagnosis not present

## 2017-08-16 DIAGNOSIS — E119 Type 2 diabetes mellitus without complications: Secondary | ICD-10-CM | POA: Diagnosis not present

## 2017-09-22 DIAGNOSIS — E785 Hyperlipidemia, unspecified: Secondary | ICD-10-CM | POA: Diagnosis not present

## 2017-09-22 DIAGNOSIS — Z6825 Body mass index (BMI) 25.0-25.9, adult: Secondary | ICD-10-CM | POA: Diagnosis not present

## 2017-09-22 DIAGNOSIS — E663 Overweight: Secondary | ICD-10-CM | POA: Diagnosis not present

## 2017-09-22 DIAGNOSIS — E1165 Type 2 diabetes mellitus with hyperglycemia: Secondary | ICD-10-CM | POA: Diagnosis not present

## 2017-09-22 DIAGNOSIS — E119 Type 2 diabetes mellitus without complications: Secondary | ICD-10-CM | POA: Diagnosis not present

## 2017-09-22 DIAGNOSIS — I10 Essential (primary) hypertension: Secondary | ICD-10-CM | POA: Diagnosis not present

## 2017-09-22 DIAGNOSIS — Z1389 Encounter for screening for other disorder: Secondary | ICD-10-CM | POA: Diagnosis not present

## 2017-09-30 ENCOUNTER — Other Ambulatory Visit: Payer: Self-pay | Admitting: "Endocrinology

## 2017-10-19 ENCOUNTER — Other Ambulatory Visit: Payer: Self-pay | Admitting: "Endocrinology

## 2017-11-12 ENCOUNTER — Other Ambulatory Visit: Payer: Self-pay | Admitting: "Endocrinology

## 2017-11-24 DIAGNOSIS — E1122 Type 2 diabetes mellitus with diabetic chronic kidney disease: Secondary | ICD-10-CM | POA: Diagnosis not present

## 2017-11-24 DIAGNOSIS — E1165 Type 2 diabetes mellitus with hyperglycemia: Secondary | ICD-10-CM | POA: Diagnosis not present

## 2017-11-24 DIAGNOSIS — N183 Chronic kidney disease, stage 3 (moderate): Secondary | ICD-10-CM | POA: Diagnosis not present

## 2017-11-25 LAB — COMPLETE METABOLIC PANEL WITH GFR
AG RATIO: 1.6 (calc) (ref 1.0–2.5)
ALKALINE PHOSPHATASE (APISO): 67 U/L (ref 40–115)
ALT: 20 U/L (ref 9–46)
AST: 19 U/L (ref 10–35)
Albumin: 4.1 g/dL (ref 3.6–5.1)
BILIRUBIN TOTAL: 0.5 mg/dL (ref 0.2–1.2)
BUN/Creatinine Ratio: 27 (calc) — ABNORMAL HIGH (ref 6–22)
BUN: 36 mg/dL — ABNORMAL HIGH (ref 7–25)
CO2: 23 mmol/L (ref 20–32)
Calcium: 8.8 mg/dL (ref 8.6–10.3)
Chloride: 108 mmol/L (ref 98–110)
Creat: 1.31 mg/dL — ABNORMAL HIGH (ref 0.70–1.25)
GFR, Est African American: 64 mL/min/{1.73_m2} (ref >=60)
GFR, Est Non African American: 56 mL/min/{1.73_m2} — ABNORMAL LOW (ref >=60)
GLOBULIN: 2.6 g/dL (ref 1.9–3.7)
Glucose, Bld: 120 mg/dL — ABNORMAL HIGH (ref 65–99)
Potassium: 4.6 mmol/L (ref 3.5–5.3)
SODIUM: 139 mmol/L (ref 135–146)
Total Protein: 6.7 g/dL (ref 6.1–8.1)

## 2017-11-25 LAB — HEMOGLOBIN A1C
Hgb A1c MFr Bld: 7.7 % of total Hgb — ABNORMAL HIGH (ref <5.7)
Mean Plasma Glucose: 174 (calc)
eAG (mmol/L): 9.7 (calc)

## 2017-12-01 ENCOUNTER — Encounter: Payer: Self-pay | Admitting: "Endocrinology

## 2017-12-01 ENCOUNTER — Ambulatory Visit (INDEPENDENT_AMBULATORY_CARE_PROVIDER_SITE_OTHER): Payer: Medicare HMO | Admitting: "Endocrinology

## 2017-12-01 VITALS — BP 133/73 | HR 80 | Ht 66.0 in | Wt 157.0 lb

## 2017-12-01 DIAGNOSIS — E782 Mixed hyperlipidemia: Secondary | ICD-10-CM | POA: Diagnosis not present

## 2017-12-01 DIAGNOSIS — IMO0002 Reserved for concepts with insufficient information to code with codable children: Secondary | ICD-10-CM

## 2017-12-01 DIAGNOSIS — N183 Chronic kidney disease, stage 3 (moderate): Secondary | ICD-10-CM | POA: Diagnosis not present

## 2017-12-01 DIAGNOSIS — E1165 Type 2 diabetes mellitus with hyperglycemia: Secondary | ICD-10-CM | POA: Diagnosis not present

## 2017-12-01 DIAGNOSIS — I10 Essential (primary) hypertension: Secondary | ICD-10-CM | POA: Diagnosis not present

## 2017-12-01 DIAGNOSIS — E1122 Type 2 diabetes mellitus with diabetic chronic kidney disease: Secondary | ICD-10-CM | POA: Diagnosis not present

## 2017-12-01 MED ORDER — METFORMIN HCL 500 MG PO TABS: ORAL_TABLET | ORAL | 1 refills | Status: DC

## 2017-12-01 NOTE — Progress Notes (Signed)
Subjective:    Patient ID: Eric Guzman, male    DOB: Mar 07, 1951. Patient is being seen in f/u for management of diabetes requested by  Sharilyn Sites, MD  Past Medical History:  Diagnosis Date  . Anemia   . Diabetes ([place])   . Foley catheter in place 07/21/2016  . Hx of duodenal ulcer 2013  . Hypercholesteremia   . Hypertension    Past Surgical History:  Procedure Laterality Date  . COLONOSCOPY  12/15/2004   RMR:.  Anal papilla.  Otherwise, normal rectum and colon  . COLONOSCOPY  Oct 2011   Dr. Gala Romney: normal  . COLONOSCOPY N/A 04/11/2014   Dr. Gala Romney: Anal papilla and internal hemorroids; otherwise normal ileocolonoscopy  . ESOPHAGOGASTRODUODENOSCOPY N/A 04/11/2014   Dr. Gala Romney: erosive reflux esophagitis.  Gastric ulcer- status post biopsy. duodenal erosions-status post biopsy, negative H.pylori  . ESOPHAGOGASTRODUODENOSCOPY N/A 08/08/2014   Procedure: ESOPHAGOGASTRODUODENOSCOPY (EGD);  Surgeon: Daneil Dolin, MD;  Location: [facility];  Service: Endoscopy;  Laterality: N/A;  1000am  . PROSTATE BIOPSY N/A 09/01/2016   Procedure: BIOPSY TRANSRECTAL ULTRASONIC PROSTATE (TUBP);  Surgeon: Irine Seal, MD;  Location: [facility];  Service: Urology;  Laterality: N/A;  . THULIUM LASER TURP (TRANSURETHRAL RESECTION OF PROSTATE) N/A 09/01/2016   Procedure: THULIUM LASER TURP (TRANSURETHRAL RESECTION OF PROSTATE);  Surgeon: Irine Seal, MD;  Location: [facility];  Service: Urology;  Laterality: N/A;   Social History   Socioeconomic History  . Marital status: Widowed    Spouse name: Not on file  . Number of children: Not on file  . Years of education: Not on file  . Highest education level: Not on file  Occupational History  . Occupation: retired    Comment: Physiological scientist  . Financial resource strain: Not on file  . Food insecurity:    Worry: Not on file    Inability: Not on file  . Transportation needs:    Medical: Not on file     Non-medical: Not on file  Tobacco Use  . Smoking status: Never Smoker  . Smokeless tobacco: Never Used  Substance and Sexual Activity  . Alcohol use: No    Alcohol/week: 0.0 oz  . Drug use: No  . Sexual activity: Not on file  Lifestyle  . Physical activity:    Days per week: Not on file    Minutes per session: Not on file  . Stress: Not on file  Relationships  . Social connections:    Talks on phone: Not on file    Gets together: Not on file    Attends religious service: Not on file    Active member of club or organization: Not on file    Attends meetings of clubs or organizations: Not on file    Relationship status: Not on file  Other Topics Concern  . Not on file  Social History Narrative  . Not on file   Outpatient Encounter Medications as of 12/01/2017  Medication Sig  . atorvastatin (LIPITOR) 20 MG tablet Take 20 mg by mouth daily.  Marland Kitchen JANUVIA 50 MG tablet TAKE ONE (1) TABLET BY MOUTH EVERY DAY  . lisinopril (PRINIVIL,ZESTRIL) 20 MG tablet TAKE ONE (1) TABLET BY MOUTH EVERY DAY  . metFORMIN (GLUCOPHAGE) 500 MG tablet 1000mg  in thea.m and 1000mg  at night.  . Multiple Vitamin (MULTIVITAMIN) tablet Take 1 tablet by mouth daily. WITH IRON  . pantoprazole (PROTONIX) 40 MG tablet Take 1 tablet (40 mg  total) by mouth daily.  . [DISCONTINUED] metFORMIN (GLUCOPHAGE) 500 MG tablet 1000mg  in thea.m and 1000mg  at night.  . [DISCONTINUED] metFORMIN (GLUCOPHAGE) 500 MG tablet TAKE ONE TABLET (500MG ) BY MOUTH IN THE MORNING AND TWO TABLETS (1000MG ) AT NIGHT  . [DISCONTINUED] sitaGLIPtin (JANUVIA) 50 MG tablet TAKE ONE (1) TABLET BY MOUTH EVERY DAY   No facility-administered encounter medications on file as of 12/01/2017.    ALLERGIES: No Known Allergies VACCINATION STATUS:  There is no immunization history on file for this patient.  Diabetes  He presents for his follow-up diabetic visit. He has type 2 diabetes mellitus. Onset time: He was diagnosed at approximate age of 67 years.  His disease course has been improving. There are no hypoglycemic associated symptoms. Pertinent negatives for hypoglycemia include no confusion, headaches, pallor or seizures. There are no diabetic associated symptoms. Pertinent negatives for diabetes include no chest pain, no fatigue, no polydipsia, no polyphagia, no polyuria and no weakness. There are no hypoglycemic complications. Symptoms are improving. Diabetic complications include nephropathy and retinopathy. Risk factors for coronary artery disease include diabetes mellitus, dyslipidemia, hypertension, male sex and tobacco exposure. Current diabetic treatment includes oral agent (dual therapy) (He is taking Januvia 100 mg by mouth twice a day and glimepiride 4 mg by mouth twice a day). His weight is increasing steadily. He is following a generally unhealthy diet. When asked about meal planning, he reported none. He has not had a previous visit with a dietitian. He participates in exercise three times a week. His home blood glucose trend is increasing steadily. An ACE inhibitor/angiotensin II receptor blocker is not being taken. Eye exam is current (Patient has history of retinopathy status post laser therapy.).  Hyperlipidemia  This is a chronic problem. The current episode started more than 1 year ago. The problem is uncontrolled. Recent lipid tests were reviewed and are variable. Exacerbating diseases include diabetes. Pertinent negatives include no chest pain, myalgias or shortness of breath. Current antihyperlipidemic treatment includes statins. Risk factors for coronary artery disease include dyslipidemia, diabetes mellitus, hypertension, male sex and a sedentary lifestyle.  Hypertension  This is a chronic problem. The current episode started more than 1 year ago. The problem is uncontrolled. Pertinent negatives include no chest pain, headaches, neck pain, palpitations or shortness of breath. Risk factors for coronary artery disease include family  history, diabetes mellitus, sedentary lifestyle and smoking/tobacco exposure. Past treatments include nothing. Hypertensive end-organ damage includes kidney disease and retinopathy.     Review of Systems  Constitutional: Negative for chills, fatigue, fever and unexpected weight change.  HENT: Negative for dental problem, mouth sores and trouble swallowing.   Eyes: Negative for visual disturbance.  Respiratory: Negative for cough, choking, chest tightness, shortness of breath and wheezing.   Cardiovascular: Negative for chest pain, palpitations and leg swelling.  Gastrointestinal: Negative for abdominal distention, abdominal pain, constipation, diarrhea, nausea and vomiting.  Endocrine: Negative for polydipsia, polyphagia and polyuria.  Genitourinary: Negative for dysuria, flank pain, hematuria and urgency.  Musculoskeletal: Negative for back pain, gait problem, myalgias and neck pain.  Skin: Negative for pallor, rash and wound.  Neurological: Negative for seizures, syncope, weakness, numbness and headaches.  Psychiatric/Behavioral: Negative for confusion and dysphoric mood.    Objective:    BP 133/73   Pulse 80   Ht 5\' 6"  (1.676 m)   Wt 157 lb (71.2 kg)   BMI 25.34 kg/m   Wt Readings from Last 3 Encounters:  12/01/17 157 lb (71.2 kg)  06/02/17 158  lb (71.7 kg)  03/01/17 152 lb (68.9 kg)    Physical Exam  Constitutional: He is oriented to person, place, and time. He appears well-developed. He is cooperative. No distress.  HENT:  Head: Normocephalic and atraumatic.  Eyes: EOM are normal.  Neck: Normal range of motion. Neck supple. No tracheal deviation present. No thyromegaly present.  Cardiovascular: Normal rate, S1 normal and S2 normal. Exam reveals no gallop.  No murmur heard. Pulses:      Dorsalis pedis pulses are 1+ on the right side, and 1+ on the left side.       Posterior tibial pulses are 1+ on the right side, and 1+ on the left side.  Pulmonary/Chest: Effort normal.  No respiratory distress. He has no wheezes.  Abdominal: He exhibits no distension. There is no tenderness. There is no guarding and no CVA tenderness.  Musculoskeletal: He exhibits no edema.       Right shoulder: He exhibits no swelling and no deformity.  Neurological: He is alert and oriented to person, place, and time. He has normal strength and normal reflexes. No cranial nerve deficit or sensory deficit. Gait normal.  Skin: Skin is warm and dry. No rash noted. No cyanosis. Nails show no clubbing.  Psychiatric: He has a normal mood and affect. His speech is normal. Judgment normal. Cognition and memory are normal.     Recent Results (from the past 2160 hour(s))  COMPLETE METABOLIC PANEL WITH GFR     Status: Abnormal   Collection Time: 11/24/17  8:06 AM  Result Value Ref Range   Glucose, Bld 120 (H) 65 - 99 mg/dL    Comment: .            Fasting reference interval . For someone without known diabetes, a glucose value between 100 and 125 mg/dL is consistent with prediabetes and should be confirmed with a follow-up test. .    BUN 36 (H) 7 - 25 mg/dL   Creat 1.31 (H) 0.70 - 1.25 mg/dL    Comment: For patients >30 years of age, the reference limit for Creatinine is approximately 13% higher for people identified as African-American. .    GFR, Est Non African American 56 (L) > OR = 60 mL/min/1.94m2   GFR, Est African American 64 > OR = 60 mL/min/1.41m2   BUN/Creatinine Ratio 27 (H) 6 - 22 (calc)   Sodium 139 135 - 146 mmol/L   Potassium 4.6 3.5 - 5.3 mmol/L   Chloride 108 98 - 110 mmol/L   CO2 23 20 - 32 mmol/L   Calcium 8.8 8.6 - 10.3 mg/dL   Total Protein 6.7 6.1 - 8.1 g/dL   Albumin 4.1 3.6 - 5.1 g/dL   Globulin 2.6 1.9 - 3.7 g/dL (calc)   AG Ratio 1.6 1.0 - 2.5 (calc)   Total Bilirubin 0.5 0.2 - 1.2 mg/dL   Alkaline phosphatase (APISO) 67 40 - 115 U/L   AST 19 10 - 35 U/L   ALT 20 9 - 46 U/L  Hemoglobin A1c     Status: Abnormal   Collection Time: 11/24/17  8:06 AM   Result Value Ref Range   Hgb A1c MFr Bld 7.7 (H) <5.7 % of total Hgb    Comment: For someone without known diabetes, a hemoglobin A1c value of 6.5% or greater indicates that they may have  diabetes and this should be confirmed with a follow-up  test. . For someone with known diabetes, a value <7% indicates  that their diabetes  is well controlled and a value  greater than or equal to 7% indicates suboptimal  control. A1c targets should be individualized based on  duration of diabetes, age, comorbid conditions, and  other considerations. . Currently, no consensus exists regarding use of hemoglobin A1c for diagnosis of diabetes for children. .    Mean Plasma Glucose 174 (calc)   eAG (mmol/L) 9.7 (calc)    On 10/22/2016 his CMP showed normal renal function, islet cell antibodies where undetectable, GAD 65 antibody is also negative.    Assessment & Plan:   1. Uncontrolled type 2 diabetes mellitus  without long-term current use of insulin ([place])  - Patient has currently uncontrolled symptomatic type 2 DM since  67 years of age. -Patient returns with a stable A1c of 7.7%, generally improving from 11%.  - Recent labs reviewed.   His diabetes is complicated by retinopathy, nephropathy,   and patient remains at a high risk for more acute and chronic complications of diabetes which include CAD, CVA, CKD, retinopathy, and neuropathy. These are all discussed in detail with the patient.  - I have counseled the patient on diet management , by adopting a carbohydrate restricted/protein rich diet.  -  Suggestion is made for him to avoid simple carbohydrates  from his diet including Cakes, Sweet Desserts / Pastries, Ice Cream, Soda (diet and regular), Sweet Tea, Candies, Chips, Cookies, Store Bought Juices, Alcohol in Excess of  1-2 drinks a day, Artificial Sweeteners, and "Sugar-free" Products. This will help patient to have stable blood glucose profile and potentially avoid unintended weight  gain.  - I encouraged the patient to switch to  unprocessed or minimally processed complex starch and increased protein intake (animal or plant source), fruits, and vegetables.  - Patient is advised to stick to a routine mealtimes to eat 3 meals  a day and avoid unnecessary snacks ( to snack only to correct hypoglycemia).    - I have approached patient with the following individualized plan to manage diabetes and patient agrees:   - Per his report he has never been overweight or obese, denies any history of pancreatitis nor heavy alcohol intake. - His presentation is not typical type 2 nor type 1 diabetes. He denies any history of diabetic ketoacidosis. His GAD 65 antibodies and anti-islet cell antibodies aree undetectable - suggesting the likelihood of type 2 diabetes rather than type I.   - I will continue  Januvia  50 mg by mouth daily , therapeutically suitable for patient.  -He has stage 3 renal insufficiency.  I advised him to decrease his metformin to 500 mg p.o. twice daily-after breakfast and supper.   - Patient is not suitable candidate for Victoza, Byetta, nor other injectable incretin therapy. -  patient specific targets for  A1c;  LDL, HDL, Triglycerides, and  Waist Circumference were discussed in detail.  2) BP/HTN: His blood pressure is controlled to target.    I have advised him to continue  lisinopril 20 mg by mouth daily.  3) Lipids/HPL:  Controlled, LDL at 58.  He is advised to continue Lipitor 20 mg p.o. Nightly.  4)  Weight/Diet: he will benefit from continued CDE Consult, exercise, and detailed carbohydrates information provided.  5) Chronic Care/Health Maintenance:  -Patient is on ACEI/ARB and Statin medications and encouraged to continue to follow up with Ophthalmology, Podiatrist at least yearly or according to recommendations, and advised to  stay away from smoking. I have recommended yearly flu vaccine and pneumonia vaccination at least every  5 years; moderate  intensity exercise for up to 150 minutes weekly; and  sleep for at least 7 hours a day.   - I advised patient to maintain close follow up with Sharilyn Sites, MD for primary care needs.  - Time spent with the patient: 25 min, of which >50% was spent in reviewing his  current and  previous labs, previous treatments, and medications doses and developing a plan for long-term care based on the latest recommendations.  Darrol Poke participated in the discussions, expressed understanding, and voiced agreement with the above plans.  All questions were answered to his satisfaction. he is encouraged to contact clinic should he have any questions or concerns prior to his return visit.  Follow up plan: - Return in about 3 months (around 03/03/2018) for follow up with pre-visit labs.  Glade Lloyd, MD Phone: [phone]  Fax: [phone]  This note was partially dictated with voice recognition software. Similar sounding words can be transcribed inadequately or may not  be corrected upon review.  12/01/2017, 1:28 PM

## 2018-01-03 DIAGNOSIS — R972 Elevated prostate specific antigen [PSA]: Secondary | ICD-10-CM | POA: Diagnosis not present

## 2018-01-03 DIAGNOSIS — E785 Hyperlipidemia, unspecified: Secondary | ICD-10-CM | POA: Diagnosis not present

## 2018-01-03 DIAGNOSIS — Z8042 Family history of malignant neoplasm of prostate: Secondary | ICD-10-CM | POA: Diagnosis not present

## 2018-01-03 DIAGNOSIS — I10 Essential (primary) hypertension: Secondary | ICD-10-CM | POA: Diagnosis not present

## 2018-01-03 DIAGNOSIS — E119 Type 2 diabetes mellitus without complications: Secondary | ICD-10-CM | POA: Diagnosis not present

## 2018-01-03 DIAGNOSIS — Z6824 Body mass index (BMI) 24.0-24.9, adult: Secondary | ICD-10-CM | POA: Diagnosis not present

## 2018-01-03 DIAGNOSIS — Z1389 Encounter for screening for other disorder: Secondary | ICD-10-CM | POA: Diagnosis not present

## 2018-01-17 ENCOUNTER — Other Ambulatory Visit: Payer: Self-pay | Admitting: "Endocrinology

## 2018-02-11 ENCOUNTER — Other Ambulatory Visit: Payer: Self-pay | Admitting: "Endocrinology

## 2018-02-14 ENCOUNTER — Encounter (INDEPENDENT_AMBULATORY_CARE_PROVIDER_SITE_OTHER): Payer: Medicare HMO | Admitting: Ophthalmology

## 2018-02-14 DIAGNOSIS — E113391 Type 2 diabetes mellitus with moderate nonproliferative diabetic retinopathy without macular edema, right eye: Secondary | ICD-10-CM | POA: Diagnosis not present

## 2018-02-14 DIAGNOSIS — E113312 Type 2 diabetes mellitus with moderate nonproliferative diabetic retinopathy with macular edema, left eye: Secondary | ICD-10-CM

## 2018-02-14 DIAGNOSIS — H43813 Vitreous degeneration, bilateral: Secondary | ICD-10-CM

## 2018-02-14 DIAGNOSIS — D3132 Benign neoplasm of left choroid: Secondary | ICD-10-CM

## 2018-02-14 DIAGNOSIS — I10 Essential (primary) hypertension: Secondary | ICD-10-CM

## 2018-02-14 DIAGNOSIS — H35033 Hypertensive retinopathy, bilateral: Secondary | ICD-10-CM

## 2018-02-14 DIAGNOSIS — E11311 Type 2 diabetes mellitus with unspecified diabetic retinopathy with macular edema: Secondary | ICD-10-CM | POA: Diagnosis not present

## 2018-02-14 DIAGNOSIS — H2513 Age-related nuclear cataract, bilateral: Secondary | ICD-10-CM | POA: Diagnosis not present

## 2018-03-07 ENCOUNTER — Ambulatory Visit: Payer: Medicare HMO | Admitting: "Endocrinology

## 2018-03-09 DIAGNOSIS — I10 Essential (primary) hypertension: Secondary | ICD-10-CM | POA: Diagnosis not present

## 2018-03-09 DIAGNOSIS — Z6825 Body mass index (BMI) 25.0-25.9, adult: Secondary | ICD-10-CM | POA: Diagnosis not present

## 2018-03-09 DIAGNOSIS — R972 Elevated prostate specific antigen [PSA]: Secondary | ICD-10-CM | POA: Diagnosis not present

## 2018-03-09 DIAGNOSIS — Z1389 Encounter for screening for other disorder: Secondary | ICD-10-CM | POA: Diagnosis not present

## 2018-03-09 DIAGNOSIS — E119 Type 2 diabetes mellitus without complications: Secondary | ICD-10-CM | POA: Diagnosis not present

## 2018-03-09 DIAGNOSIS — E785 Hyperlipidemia, unspecified: Secondary | ICD-10-CM | POA: Diagnosis not present

## 2018-03-09 DIAGNOSIS — Z0001 Encounter for general adult medical examination with abnormal findings: Secondary | ICD-10-CM | POA: Diagnosis not present

## 2018-03-09 DIAGNOSIS — E663 Overweight: Secondary | ICD-10-CM | POA: Diagnosis not present

## 2018-03-21 DIAGNOSIS — R310 Gross hematuria: Secondary | ICD-10-CM | POA: Diagnosis not present

## 2018-03-21 DIAGNOSIS — R319 Hematuria, unspecified: Secondary | ICD-10-CM | POA: Diagnosis not present

## 2018-03-21 DIAGNOSIS — Z6825 Body mass index (BMI) 25.0-25.9, adult: Secondary | ICD-10-CM | POA: Diagnosis not present

## 2018-03-21 DIAGNOSIS — E663 Overweight: Secondary | ICD-10-CM | POA: Diagnosis not present

## 2018-03-21 DIAGNOSIS — Z1389 Encounter for screening for other disorder: Secondary | ICD-10-CM | POA: Diagnosis not present

## 2018-03-21 DIAGNOSIS — R3129 Other microscopic hematuria: Secondary | ICD-10-CM | POA: Diagnosis not present

## 2018-03-21 DIAGNOSIS — R3121 Asymptomatic microscopic hematuria: Secondary | ICD-10-CM | POA: Diagnosis not present

## 2018-03-28 DIAGNOSIS — E1122 Type 2 diabetes mellitus with diabetic chronic kidney disease: Secondary | ICD-10-CM | POA: Diagnosis not present

## 2018-03-28 DIAGNOSIS — N183 Chronic kidney disease, stage 3 (moderate): Secondary | ICD-10-CM | POA: Diagnosis not present

## 2018-03-28 DIAGNOSIS — E1165 Type 2 diabetes mellitus with hyperglycemia: Secondary | ICD-10-CM | POA: Diagnosis not present

## 2018-03-28 LAB — COMPLETE METABOLIC PANEL WITH GFR
AG Ratio: 1.6 (calc) (ref 1.0–2.5)
ALBUMIN MSPROF: 4.3 g/dL (ref 3.6–5.1)
ALKALINE PHOSPHATASE (APISO): 66 U/L (ref 40–115)
ALT: 20 U/L (ref 9–46)
AST: 20 U/L (ref 10–35)
BUN: 24 mg/dL (ref 7–25)
CHLORIDE: 104 mmol/L (ref 98–110)
CO2: 29 mmol/L (ref 20–32)
Calcium: 9.4 mg/dL (ref 8.6–10.3)
Creat: 1.06 mg/dL (ref 0.70–1.25)
GFR, Est African American: 83 mL/min/{1.73_m2} (ref >=60)
GFR, Est Non African American: 71 mL/min/{1.73_m2} (ref >=60)
GLOBULIN: 2.7 g/dL (ref 1.9–3.7)
Glucose, Bld: 194 mg/dL — ABNORMAL HIGH (ref 65–99)
Potassium: 4.3 mmol/L (ref 3.5–5.3)
SODIUM: 139 mmol/L (ref 135–146)
Total Bilirubin: 0.5 mg/dL (ref 0.2–1.2)
Total Protein: 7 g/dL (ref 6.1–8.1)

## 2018-03-28 LAB — HEMOGLOBIN A1C
HEMOGLOBIN A1C: 8.2 %{Hb} — AB (ref <5.7)
Mean Plasma Glucose: 189 (calc)
eAG (mmol/L): 10.4 (calc)

## 2018-03-31 ENCOUNTER — Encounter: Payer: Self-pay | Admitting: "Endocrinology

## 2018-03-31 ENCOUNTER — Ambulatory Visit: Payer: Medicare HMO | Admitting: "Endocrinology

## 2018-03-31 VITALS — BP 131/72 | HR 76 | Ht 66.0 in | Wt 152.0 lb

## 2018-03-31 DIAGNOSIS — N183 Chronic kidney disease, stage 3 (moderate): Secondary | ICD-10-CM

## 2018-03-31 DIAGNOSIS — E1165 Type 2 diabetes mellitus with hyperglycemia: Secondary | ICD-10-CM

## 2018-03-31 DIAGNOSIS — E1122 Type 2 diabetes mellitus with diabetic chronic kidney disease: Secondary | ICD-10-CM | POA: Diagnosis not present

## 2018-03-31 DIAGNOSIS — E782 Mixed hyperlipidemia: Secondary | ICD-10-CM | POA: Diagnosis not present

## 2018-03-31 DIAGNOSIS — I10 Essential (primary) hypertension: Secondary | ICD-10-CM | POA: Diagnosis not present

## 2018-03-31 DIAGNOSIS — IMO0002 Reserved for concepts with insufficient information to code with codable children: Secondary | ICD-10-CM

## 2018-03-31 MED ORDER — METFORMIN HCL 500 MG PO TABS: 500.0000 mg | ORAL_TABLET | Freq: Three times a day (TID) | ORAL | 1 refills | Status: DC

## 2018-03-31 NOTE — Progress Notes (Signed)
Endocrinology follow-up note   Subjective:    Patient ID: Eric Guzman, male    DOB: 1951-06-18. Patient is being seen in f/u for management of type 2 diabetes, hyperlipidemia, hypertension. PMD:   Sharilyn Sites, MD  Past Medical History:  Diagnosis Date  . Anemia   . Diabetes ([place])   . Foley catheter in place 07/21/2016  . Hx of duodenal ulcer 2013  . Hypercholesteremia   . Hypertension    Past Surgical History:  Procedure Laterality Date  . COLONOSCOPY  12/15/2004   RMR:.  Anal papilla.  Otherwise, normal rectum and colon  . COLONOSCOPY  Oct 2011   Dr. Gala Romney: normal  . COLONOSCOPY N/A 04/11/2014   Dr. Gala Romney: Anal papilla and internal hemorroids; otherwise normal ileocolonoscopy  . ESOPHAGOGASTRODUODENOSCOPY N/A 04/11/2014   Dr. Gala Romney: erosive reflux esophagitis.  Gastric ulcer- status post biopsy. duodenal erosions-status post biopsy, negative H.pylori  . ESOPHAGOGASTRODUODENOSCOPY N/A 08/08/2014   Procedure: ESOPHAGOGASTRODUODENOSCOPY (EGD);  Surgeon: Daneil Dolin, MD;  Location: [facility];  Service: Endoscopy;  Laterality: N/A;  1000am  . PROSTATE BIOPSY N/A 09/01/2016   Procedure: BIOPSY TRANSRECTAL ULTRASONIC PROSTATE (TUBP);  Surgeon: Irine Seal, MD;  Location: [facility];  Service: Urology;  Laterality: N/A;  . THULIUM LASER TURP (TRANSURETHRAL RESECTION OF PROSTATE) N/A 09/01/2016   Procedure: THULIUM LASER TURP (TRANSURETHRAL RESECTION OF PROSTATE);  Surgeon: Irine Seal, MD;  Location: [facility];  Service: Urology;  Laterality: N/A;   Social History   Socioeconomic History  . Marital status: Widowed    Spouse name: Not on file  . Number of children: Not on file  . Years of education: Not on file  . Highest education level: Not on file  Occupational History  . Occupation: retired    Comment: Physiological scientist  . Financial resource strain: Not on file  . Food insecurity:    Worry: Not on file    Inability: Not on  file  . Transportation needs:    Medical: Not on file    Non-medical: Not on file  Tobacco Use  . Smoking status: Never Smoker  . Smokeless tobacco: Never Used  Substance and Sexual Activity  . Alcohol use: No    Alcohol/week: 0.0 standard drinks  . Drug use: No  . Sexual activity: Not on file  Lifestyle  . Physical activity:    Days per week: Not on file    Minutes per session: Not on file  . Stress: Not on file  Relationships  . Social connections:    Talks on phone: Not on file    Gets together: Not on file    Attends religious service: Not on file    Active member of club or organization: Not on file    Attends meetings of clubs or organizations: Not on file    Relationship status: Not on file  Other Topics Concern  . Not on file  Social History Narrative  . Not on file   Outpatient Encounter Medications as of 03/31/2018  Medication Sig  . atorvastatin (LIPITOR) 20 MG tablet Take 20 mg by mouth daily.  Marland Kitchen JANUVIA 50 MG tablet TAKE ONE (1) TABLET BY MOUTH EVERY DAY  . lisinopril (PRINIVIL,ZESTRIL) 20 MG tablet TAKE ONE (1) TABLET BY MOUTH EVERY DAY  . metFORMIN (GLUCOPHAGE) 500 MG tablet Take 1 tablet (500 mg total) by mouth 3 (three) times daily after meals. 1000mg  in thea.m and 1000mg  at night.  . Multiple  Vitamin (MULTIVITAMIN) tablet Take 1 tablet by mouth daily. WITH IRON  . pantoprazole (PROTONIX) 40 MG tablet Take 1 tablet (40 mg total) by mouth daily.  . [DISCONTINUED] metFORMIN (GLUCOPHAGE) 500 MG tablet 1000mg  in thea.m and 1000mg  at night.   No facility-administered encounter medications on file as of 03/31/2018.    ALLERGIES: No Known Allergies VACCINATION STATUS:  There is no immunization history on file for this patient.  Diabetes  He presents for his follow-up diabetic visit. He has type 2 diabetes mellitus. Onset time: He was diagnosed at approximate age of 67 years. His disease course has been worsening. There are no hypoglycemic associated  symptoms. Pertinent negatives for hypoglycemia include no confusion, headaches, pallor or seizures. There are no diabetic associated symptoms. Pertinent negatives for diabetes include no chest pain, no fatigue, no polydipsia, no polyphagia, no polyuria and no weakness. There are no hypoglycemic complications. Symptoms are worsening. Diabetic complications include nephropathy and retinopathy. Risk factors for coronary artery disease include diabetes mellitus, dyslipidemia, hypertension, male sex and tobacco exposure. Current diabetic treatment includes oral agent (dual therapy) (He is taking Januvia 100 mg by mouth twice a day and glimepiride 4 mg by mouth twice a day). His weight is decreasing steadily. He is following a generally unhealthy diet. When asked about meal planning, he reported none. He has not had a previous visit with a dietitian. He participates in exercise intermittently. An ACE inhibitor/angiotensin II receptor blocker is not being taken. Eye exam is current (Patient has history of retinopathy status post laser therapy.).  Hyperlipidemia  This is a chronic problem. The current episode started more than 1 year ago. The problem is uncontrolled. Recent lipid tests were reviewed and are variable. Exacerbating diseases include diabetes. Pertinent negatives include no chest pain, myalgias or shortness of breath. Current antihyperlipidemic treatment includes statins. Risk factors for coronary artery disease include dyslipidemia, diabetes mellitus, hypertension, male sex and a sedentary lifestyle.  Hypertension  This is a chronic problem. The current episode started more than 1 year ago. The problem is uncontrolled. Pertinent negatives include no chest pain, headaches, neck pain, palpitations or shortness of breath. Risk factors for coronary artery disease include family history, diabetes mellitus, sedentary lifestyle and smoking/tobacco exposure. Past treatments include nothing. Hypertensive end-organ  damage includes kidney disease and retinopathy.     Review of Systems  Constitutional: Negative for chills, fatigue, fever and unexpected weight change.  HENT: Negative for dental problem, mouth sores and trouble swallowing.   Eyes: Negative for visual disturbance.  Respiratory: Negative for cough, choking, chest tightness, shortness of breath and wheezing.   Cardiovascular: Negative for chest pain, palpitations and leg swelling.  Gastrointestinal: Negative for abdominal distention, abdominal pain, constipation, diarrhea, nausea and vomiting.  Endocrine: Negative for polydipsia, polyphagia and polyuria.  Genitourinary: Negative for dysuria, flank pain, hematuria and urgency.  Musculoskeletal: Negative for back pain, gait problem, myalgias and neck pain.  Skin: Negative for pallor, rash and wound.  Neurological: Negative for seizures, syncope, weakness, numbness and headaches.  Psychiatric/Behavioral: Negative for confusion and dysphoric mood.    Objective:    BP 131/72   Pulse 76   Ht 5\' 6"  (1.676 m)   Wt 152 lb (68.9 kg)   BMI 24.53 kg/m   Wt Readings from Last 3 Encounters:  03/31/18 152 lb (68.9 kg)  12/01/17 157 lb (71.2 kg)  06/02/17 158 lb (71.7 kg)    Physical Exam  Constitutional: He is oriented to person, place, and time. He appears well-developed.  He is cooperative. No distress.  HENT:  Head: Normocephalic and atraumatic.  Eyes: EOM are normal.  Neck: Normal range of motion. Neck supple. No tracheal deviation present. No thyromegaly present.  Cardiovascular: Normal rate, S1 normal and S2 normal. Exam reveals no gallop.  No murmur heard. Pulses:      Dorsalis pedis pulses are 1+ on the right side, and 1+ on the left side.       Posterior tibial pulses are 1+ on the right side, and 1+ on the left side.  Pulmonary/Chest: Effort normal. No respiratory distress. He has no wheezes.  Abdominal: He exhibits no distension. There is no tenderness. There is no guarding and  no CVA tenderness.  Musculoskeletal: He exhibits no edema.       Right shoulder: He exhibits no swelling and no deformity.  Neurological: He is alert and oriented to person, place, and time. He has normal strength and normal reflexes. No cranial nerve deficit or sensory deficit. Gait normal.  Skin: Skin is warm and dry. No rash noted. No cyanosis. Nails show no clubbing.  Psychiatric: He has a normal mood and affect. His speech is normal. Judgment normal. Cognition and memory are normal.     Recent Results (from the past 2160 hour(s))  COMPLETE METABOLIC PANEL WITH GFR     Status: Abnormal   Collection Time: 03/28/18  8:03 AM  Result Value Ref Range   Glucose, Bld 194 (H) 65 - 99 mg/dL    Comment: .            Fasting reference interval . For someone without known diabetes, a glucose value >125 mg/dL indicates that they may have diabetes and this should be confirmed with a follow-up test. .    BUN 24 7 - 25 mg/dL   Creat 1.06 0.70 - 1.25 mg/dL    Comment: For patients >69 years of age, the reference limit for Creatinine is approximately 13% higher for people identified as African-American. .    GFR, Est Non African American 71 > OR = 60 mL/min/1.9m2   GFR, Est African American 83 > OR = 60 mL/min/1.53m2   BUN/Creatinine Ratio NOT APPLICABLE 6 - 22 (calc)   Sodium 139 135 - 146 mmol/L   Potassium 4.3 3.5 - 5.3 mmol/L   Chloride 104 98 - 110 mmol/L   CO2 29 20 - 32 mmol/L   Calcium 9.4 8.6 - 10.3 mg/dL   Total Protein 7.0 6.1 - 8.1 g/dL   Albumin 4.3 3.6 - 5.1 g/dL   Globulin 2.7 1.9 - 3.7 g/dL (calc)   AG Ratio 1.6 1.0 - 2.5 (calc)   Total Bilirubin 0.5 0.2 - 1.2 mg/dL   Alkaline phosphatase (APISO) 66 40 - 115 U/L   AST 20 10 - 35 U/L   ALT 20 9 - 46 U/L  Hemoglobin A1c     Status: Abnormal   Collection Time: 03/28/18  8:03 AM  Result Value Ref Range   Hgb A1c MFr Bld 8.2 (H) <5.7 % of total Hgb    Comment: For someone without known diabetes, a hemoglobin A1c value  of 6.5% or greater indicates that they may have  diabetes and this should be confirmed with a follow-up  test. . For someone with known diabetes, a value <7% indicates  that their diabetes is well controlled and a value  greater than or equal to 7% indicates suboptimal  control. A1c targets should be individualized based on  duration of diabetes, age, comorbid  conditions, and  other considerations. . Currently, no consensus exists regarding use of hemoglobin A1c for diagnosis of diabetes for children. .    Mean Plasma Glucose 189 (calc)   eAG (mmol/L) 10.4 (calc)    On 10/22/2016 his CMP showed normal renal function, islet cell antibodies where undetectable, GAD 65 antibody is also negative.    Assessment & Plan:   1. Uncontrolled type 2 diabetes mellitus  without long-term current use of insulin ([place])  - Patient has currently uncontrolled symptomatic type 2 DM since  67 years of age. -Patient returns with increasing A1c of 8.2% from 7.7%, although generally improving from 11.0% .  - Recent labs reviewed.   His diabetes is complicated by retinopathy, nephropathy,   and patient remains at a high risk for more acute and chronic complications of diabetes which include CAD, CVA, CKD, retinopathy, and neuropathy. These are all discussed in detail with the patient.  - I have counseled the patient on diet management , by adopting a carbohydrate restricted/protein rich diet.  -He still admits to dietary indiscretions including consumption of sweetened beverages.  -  Suggestion is made for him to avoid simple carbohydrates  from his diet including Cakes, Sweet Desserts / Pastries, Ice Cream, Soda (diet and regular), Sweet Tea, Candies, Chips, Cookies, Store Bought Juices, Alcohol in Excess of  1-2 drinks a day, Artificial Sweeteners, and "Sugar-free" Products. This will help patient to have stable blood glucose profile and potentially avoid unintended weight gain.  - I encouraged the  patient to switch to  unprocessed or minimally processed complex starch and increased protein intake (animal or plant source), fruits, and vegetables.  - Patient is advised to stick to a routine mealtimes to eat 3 meals  a day and avoid unnecessary snacks ( to snack only to correct hypoglycemia).    - I have approached patient with the following individualized plan to manage diabetes and patient agrees:   - Per his report he has never been overweight or obese, denies any history of pancreatitis nor heavy alcohol intake. - His presentation is not typical type 2 nor type 1 diabetes. He denies any history of diabetic ketoacidosis. His GAD 65 antibodies and anti-islet cell antibodies aree undetectable - suggesting the likelihood of type 2 diabetes rather than type 1.  -He is advised to increase metformin to 500 mg p.o. 3 times daily after breakfast, lunch, and supper. -He is advised to continue Januvia 50 mg p.o. daily, therapeutically suitable for patient.  - Patient is not suitable candidate for Victoza, Byetta, nor other injectable incretin therapy. -  patient specific targets for  A1c;  LDL, HDL, Triglycerides, and  Waist Circumference were discussed in detail.  2) BP/HTN: His blood pressure is controlled to target.  He is advised to continue his current blood pressure medications including lisinopril 20 mg p.o. Daily.  3) Lipids/HPL: His recent lipid panel showed controlled LDL at 58.  He is advised to continue Lipitor 20 mg p.o. nightly.   4)  Weight/Diet: he will benefit from continued CDE Consult, exercise, and detailed carbohydrates information provided.  5) Chronic Care/Health Maintenance:  -Patient is on ACEI/ARB and Statin medications and encouraged to continue to follow up with Ophthalmology, Podiatrist at least yearly or according to recommendations, and advised to  stay away from smoking. I have recommended yearly flu vaccine and pneumonia vaccination at least every 5 years;  moderate intensity exercise for up to 150 minutes weekly; and  sleep for at least 7 hours  a day.   - I advised patient to maintain close follow up with Sharilyn Sites, MD for primary care needs.  - Time spent with the patient: 25 min, of which >50% was spent in reviewing his  current and  previous labs, previous treatments, and medications doses and developing a plan for long-term care.  Darrol Poke participated in the discussions, expressed understanding, and voiced agreement with the above plans.  All questions were answered to his satisfaction. he is encouraged to contact clinic should he have any questions or concerns prior to his return visit.   Follow up plan: - Return in about 3 months (around 07/01/2018) for Follow up with Pre-visit Labs.  Glade Lloyd, MD Phone: [phone]  Fax: [phone]  This note was partially dictated with voice recognition software. Similar sounding words can be transcribed inadequately or may not  be corrected upon review.  03/31/2018, 2:16 PM

## 2018-03-31 NOTE — Patient Instructions (Signed)

## 2018-04-11 DIAGNOSIS — E785 Hyperlipidemia, unspecified: Secondary | ICD-10-CM | POA: Diagnosis not present

## 2018-04-11 DIAGNOSIS — Z6825 Body mass index (BMI) 25.0-25.9, adult: Secondary | ICD-10-CM | POA: Diagnosis not present

## 2018-04-11 DIAGNOSIS — E119 Type 2 diabetes mellitus without complications: Secondary | ICD-10-CM | POA: Diagnosis not present

## 2018-04-11 DIAGNOSIS — I10 Essential (primary) hypertension: Secondary | ICD-10-CM | POA: Diagnosis not present

## 2018-04-11 DIAGNOSIS — E663 Overweight: Secondary | ICD-10-CM | POA: Diagnosis not present

## 2018-05-16 ENCOUNTER — Other Ambulatory Visit: Payer: Self-pay | Admitting: "Endocrinology

## 2018-06-08 DIAGNOSIS — I10 Essential (primary) hypertension: Secondary | ICD-10-CM | POA: Diagnosis not present

## 2018-06-23 ENCOUNTER — Other Ambulatory Visit: Payer: Self-pay | Admitting: "Endocrinology

## 2018-06-28 DIAGNOSIS — E1165 Type 2 diabetes mellitus with hyperglycemia: Secondary | ICD-10-CM | POA: Diagnosis not present

## 2018-06-28 DIAGNOSIS — E7849 Other hyperlipidemia: Secondary | ICD-10-CM | POA: Diagnosis not present

## 2018-06-28 DIAGNOSIS — E782 Mixed hyperlipidemia: Secondary | ICD-10-CM | POA: Diagnosis not present

## 2018-06-28 DIAGNOSIS — E1122 Type 2 diabetes mellitus with diabetic chronic kidney disease: Secondary | ICD-10-CM | POA: Diagnosis not present

## 2018-06-28 DIAGNOSIS — N183 Chronic kidney disease, stage 3 (moderate): Secondary | ICD-10-CM | POA: Diagnosis not present

## 2018-06-29 LAB — LIPID PANEL
CHOLESTEROL: 121 mg/dL (ref <200)
HDL: 37 mg/dL — ABNORMAL LOW (ref >40)
LDL Cholesterol (Calc): 69 mg/dL (calc)
NON-HDL CHOLESTEROL (CALC): 84 mg/dL (ref <130)
TRIGLYCERIDES: 72 mg/dL (ref <150)
Total CHOL/HDL Ratio: 3.3 (calc) (ref <5.0)

## 2018-06-29 LAB — MICROALBUMIN / CREATININE URINE RATIO
CREATININE, URINE: 83 mg/dL (ref 20–320)
MICROALB UR: 6.6 mg/dL
MICROALB/CREAT RATIO: 80 ug/mg{creat} — AB (ref <30)

## 2018-06-29 LAB — COMPLETE METABOLIC PANEL WITH GFR
AG Ratio: 1.9 (calc) (ref 1.0–2.5)
ALBUMIN MSPROF: 4.3 g/dL (ref 3.6–5.1)
ALKALINE PHOSPHATASE (APISO): 74 U/L (ref 40–115)
ALT: 23 U/L (ref 9–46)
AST: 18 U/L (ref 10–35)
BUN/Creatinine Ratio: 27 (calc) — ABNORMAL HIGH (ref 6–22)
BUN: 31 mg/dL — ABNORMAL HIGH (ref 7–25)
CO2: 26 mmol/L (ref 20–32)
CREATININE: 1.13 mg/dL (ref 0.70–1.25)
Calcium: 9.1 mg/dL (ref 8.6–10.3)
Chloride: 107 mmol/L (ref 98–110)
GFR, Est African American: 76 mL/min/{1.73_m2} (ref >=60)
GFR, Est Non African American: 66 mL/min/{1.73_m2} (ref >=60)
GLOBULIN: 2.3 g/dL (ref 1.9–3.7)
Glucose, Bld: 220 mg/dL — ABNORMAL HIGH (ref 65–99)
Potassium: 4.3 mmol/L (ref 3.5–5.3)
Sodium: 142 mmol/L (ref 135–146)
Total Bilirubin: 0.4 mg/dL (ref 0.2–1.2)
Total Protein: 6.6 g/dL (ref 6.1–8.1)

## 2018-06-29 LAB — T4, FREE: Free T4: 1.2 ng/dL (ref 0.8–1.8)

## 2018-06-29 LAB — HEMOGLOBIN A1C
HEMOGLOBIN A1C: 9 %{Hb} — AB (ref <5.7)
Mean Plasma Glucose: 212 (calc)
eAG (mmol/L): 11.7 (calc)

## 2018-06-29 LAB — VITAMIN B12: Vitamin B-12: 729 pg/mL (ref 200–1100)

## 2018-06-29 LAB — TSH: TSH: 1.01 mIU/L (ref 0.40–4.50)

## 2018-06-29 LAB — VITAMIN D 25 HYDROXY (VIT D DEFICIENCY, FRACTURES): Vit D, 25-Hydroxy: 26 ng/mL — ABNORMAL LOW (ref 30–100)

## 2018-07-01 ENCOUNTER — Encounter: Payer: Self-pay | Admitting: "Endocrinology

## 2018-07-01 ENCOUNTER — Ambulatory Visit (INDEPENDENT_AMBULATORY_CARE_PROVIDER_SITE_OTHER): Payer: Medicare HMO | Admitting: "Endocrinology

## 2018-07-01 VITALS — BP 136/78 | HR 97 | Ht 66.0 in | Wt 156.0 lb

## 2018-07-01 DIAGNOSIS — E1165 Type 2 diabetes mellitus with hyperglycemia: Secondary | ICD-10-CM | POA: Diagnosis not present

## 2018-07-01 DIAGNOSIS — N183 Chronic kidney disease, stage 3 (moderate): Secondary | ICD-10-CM | POA: Diagnosis not present

## 2018-07-01 DIAGNOSIS — IMO0002 Reserved for concepts with insufficient information to code with codable children: Secondary | ICD-10-CM

## 2018-07-01 DIAGNOSIS — E1122 Type 2 diabetes mellitus with diabetic chronic kidney disease: Secondary | ICD-10-CM

## 2018-07-01 DIAGNOSIS — E782 Mixed hyperlipidemia: Secondary | ICD-10-CM | POA: Diagnosis not present

## 2018-07-01 DIAGNOSIS — I10 Essential (primary) hypertension: Secondary | ICD-10-CM

## 2018-07-01 NOTE — Progress Notes (Signed)
Endocrinology follow-up note   Subjective:    Patient ID: Eric Guzman, male    DOB: 1950-09-26. Patient is being seen in f/u for management of type 2 diabetes, hyperlipidemia, hypertension. PMD:   Sharilyn Sites, MD  Past Medical History:  Diagnosis Date  . Anemia   . Diabetes ([place])   . Foley catheter in place 07/21/2016  . Hx of duodenal ulcer 2013  . Hypercholesteremia   . Hypertension    Past Surgical History:  Procedure Laterality Date  . COLONOSCOPY  12/15/2004   RMR:.  Anal papilla.  Otherwise, normal rectum and colon  . COLONOSCOPY  Oct 2011   Dr. Gala Romney: normal  . COLONOSCOPY N/A 04/11/2014   Dr. Gala Romney: Anal papilla and internal hemorroids; otherwise normal ileocolonoscopy  . ESOPHAGOGASTRODUODENOSCOPY N/A 04/11/2014   Dr. Gala Romney: erosive reflux esophagitis.  Gastric ulcer- status post biopsy. duodenal erosions-status post biopsy, negative H.pylori  . ESOPHAGOGASTRODUODENOSCOPY N/A 08/08/2014   Procedure: ESOPHAGOGASTRODUODENOSCOPY (EGD);  Surgeon: Daneil Dolin, MD;  Location: [facility];  Service: Endoscopy;  Laterality: N/A;  1000am  . PROSTATE BIOPSY N/A 09/01/2016   Procedure: BIOPSY TRANSRECTAL ULTRASONIC PROSTATE (TUBP);  Surgeon: Irine Seal, MD;  Location: [facility];  Service: Urology;  Laterality: N/A;  . THULIUM LASER TURP (TRANSURETHRAL RESECTION OF PROSTATE) N/A 09/01/2016   Procedure: THULIUM LASER TURP (TRANSURETHRAL RESECTION OF PROSTATE);  Surgeon: Irine Seal, MD;  Location: [facility];  Service: Urology;  Laterality: N/A;   Social History   Socioeconomic History  . Marital status: Widowed    Spouse name: Not on file  . Number of children: Not on file  . Years of education: Not on file  . Highest education level: Not on file  Occupational History  . Occupation: retired    Comment: Physiological scientist  . Financial resource strain: Not on file  . Food insecurity:    Worry: Not on file    Inability: Not on  file  . Transportation needs:    Medical: Not on file    Non-medical: Not on file  Tobacco Use  . Smoking status: Never Smoker  . Smokeless tobacco: Never Used  Substance and Sexual Activity  . Alcohol use: No    Alcohol/week: 0.0 standard drinks  . Drug use: No  . Sexual activity: Not on file  Lifestyle  . Physical activity:    Days per week: Not on file    Minutes per session: Not on file  . Stress: Not on file  Relationships  . Social connections:    Talks on phone: Not on file    Gets together: Not on file    Attends religious service: Not on file    Active member of club or organization: Not on file    Attends meetings of clubs or organizations: Not on file    Relationship status: Not on file  Other Topics Concern  . Not on file  Social History Narrative  . Not on file   Outpatient Encounter Medications as of 07/01/2018  Medication Sig  . metFORMIN (GLUCOPHAGE) 1000 MG tablet Take 1,000 mg by mouth 3 (three) times daily.  Marland Kitchen atorvastatin (LIPITOR) 20 MG tablet Take 20 mg by mouth daily.  Marland Kitchen JANUVIA 50 MG tablet TAKE ONE (1) TABLET BY MOUTH EVERY DAY  . lisinopril (PRINIVIL,ZESTRIL) 20 MG tablet TAKE ONE (1) TABLET BY MOUTH EVERY DAY  . Multiple Vitamin (MULTIVITAMIN) tablet Take 1 tablet by mouth daily. WITH IRON  .  pantoprazole (PROTONIX) 40 MG tablet Take 1 tablet (40 mg total) by mouth daily.  . [DISCONTINUED] metFORMIN (GLUCOPHAGE) 1000 MG tablet TAKE ONE TABLET BY MOUTH TWICE A DAY (Patient taking differently: Take 500 mg by mouth 3 (three) times daily. )  . [DISCONTINUED] metFORMIN (GLUCOPHAGE) 500 MG tablet Take 1 tablet (500 mg total) by mouth 3 (three) times daily after meals. 1000mg  in thea.m and 1000mg  at night.   No facility-administered encounter medications on file as of 07/01/2018.    ALLERGIES: No Known Allergies VACCINATION STATUS:  There is no immunization history on file for this patient.  Diabetes  He presents for his follow-up diabetic visit. He  has type 2 diabetes mellitus. Onset time: He was diagnosed at approximate age of 21 years. His disease course has been worsening. There are no hypoglycemic associated symptoms. Pertinent negatives for hypoglycemia include no confusion, headaches, pallor or seizures. There are no diabetic associated symptoms. Pertinent negatives for diabetes include no chest pain, no fatigue, no polydipsia, no polyphagia, no polyuria and no weakness. There are no hypoglycemic complications. Symptoms are worsening. Diabetic complications include nephropathy and retinopathy. Risk factors for coronary artery disease include diabetes mellitus, dyslipidemia, hypertension, male sex and tobacco exposure. Current diabetic treatment includes oral agent (dual therapy) (He is taking Januvia 100 mg by mouth twice a day and glimepiride 4 mg by mouth twice a day). His weight is fluctuating minimally. He is following a generally unhealthy diet. When asked about meal planning, he reported none. He has not had a previous visit with a dietitian. He participates in exercise intermittently. His home blood glucose trend is increasing steadily. An ACE inhibitor/angiotensin II receptor blocker is not being taken. Eye exam is current (Patient has history of retinopathy status post laser therapy.).  Hyperlipidemia  This is a chronic problem. The current episode started more than 1 year ago. The problem is uncontrolled. Recent lipid tests were reviewed and are variable. Exacerbating diseases include diabetes. Pertinent negatives include no chest pain, myalgias or shortness of breath. Current antihyperlipidemic treatment includes statins. Risk factors for coronary artery disease include dyslipidemia, diabetes mellitus, hypertension, male sex and a sedentary lifestyle.  Hypertension  This is a chronic problem. The current episode started more than 1 year ago. The problem is uncontrolled. Pertinent negatives include no chest pain, headaches, neck pain,  palpitations or shortness of breath. Risk factors for coronary artery disease include family history, diabetes mellitus, sedentary lifestyle and smoking/tobacco exposure. Past treatments include nothing. Hypertensive end-organ damage includes kidney disease and retinopathy.    Review of Systems  Constitutional: Negative for chills, fatigue, fever and unexpected weight change.  HENT: Negative for dental problem, mouth sores and trouble swallowing.   Eyes: Negative for visual disturbance.  Respiratory: Negative for cough, choking, chest tightness, shortness of breath and wheezing.   Cardiovascular: Negative for chest pain, palpitations and leg swelling.  Gastrointestinal: Negative for abdominal distention, abdominal pain, constipation, diarrhea, nausea and vomiting.  Endocrine: Negative for polydipsia, polyphagia and polyuria.  Genitourinary: Negative for dysuria, flank pain, hematuria and urgency.  Musculoskeletal: Negative for back pain, gait problem, myalgias and neck pain.  Skin: Negative for pallor, rash and wound.  Neurological: Negative for seizures, syncope, weakness, numbness and headaches.  Psychiatric/Behavioral: Negative for confusion and dysphoric mood.    Objective:    BP 136/78   Pulse 97   Ht 5\' 6"  (1.676 m)   Wt 156 lb (70.8 kg)   BMI 25.18 kg/m   Wt Readings from Last 3  Encounters:  07/01/18 156 lb (70.8 kg)  03/31/18 152 lb (68.9 kg)  12/01/17 157 lb (71.2 kg)    Physical Exam  Constitutional: He is oriented to person, place, and time. He appears well-developed. He is cooperative. No distress.  HENT:  Head: Normocephalic and atraumatic.  Eyes: EOM are normal.  Neck: Normal range of motion. Neck supple. No tracheal deviation present. No thyromegaly present.  Cardiovascular: Normal rate, S1 normal and S2 normal. Exam reveals no gallop.  No murmur heard. Pulses:      Dorsalis pedis pulses are 1+ on the right side and 1+ on the left side.       Posterior tibial  pulses are 1+ on the right side and 1+ on the left side.  Pulmonary/Chest: Effort normal. No respiratory distress. He has no wheezes.  Abdominal: He exhibits no distension. There is no abdominal tenderness. There is no guarding and no CVA tenderness.  Musculoskeletal:        General: No edema.     Right shoulder: He exhibits no swelling and no deformity.  Neurological: He is alert and oriented to person, place, and time. He has normal strength and normal reflexes. No cranial nerve deficit or sensory deficit. Gait normal.  Skin: Skin is warm and dry. No rash noted. No cyanosis. Nails show no clubbing.  Psychiatric: He has a normal mood and affect. His speech is normal. Judgment normal. Cognition and memory are normal.     Recent Results (from the past 2160 hour(s))  Hemoglobin A1c     Status: Abnormal   Collection Time: 06/28/18  8:11 AM  Result Value Ref Range   Hgb A1c MFr Bld 9.0 (H) <5.7 % of total Hgb    Comment: For someone without known diabetes, a hemoglobin A1c value of 6.5% or greater indicates that they may have  diabetes and this should be confirmed with a follow-up  test. . For someone with known diabetes, a value <7% indicates  that their diabetes is well controlled and a value  greater than or equal to 7% indicates suboptimal  control. A1c targets should be individualized based on  duration of diabetes, age, comorbid conditions, and  other considerations. . Currently, no consensus exists regarding use of hemoglobin A1c for diagnosis of diabetes for children. .    Mean Plasma Glucose 212 (calc)   eAG (mmol/L) 11.7 (calc)  COMPLETE METABOLIC PANEL WITH GFR     Status: Abnormal   Collection Time: 06/28/18  8:11 AM  Result Value Ref Range   Glucose, Bld 220 (H) 65 - 99 mg/dL    Comment: .            Fasting reference interval . For someone without known diabetes, a glucose value >125 mg/dL indicates that they may have diabetes and this should be confirmed with  a follow-up test. .    BUN 31 (H) 7 - 25 mg/dL   Creat 1.13 0.70 - 1.25 mg/dL    Comment: For patients >40 years of age, the reference limit for Creatinine is approximately 13% higher for people identified as African-American. .    GFR, Est Non African American 66 > OR = 60 mL/min/1.40m2   GFR, Est African American 76 > OR = 60 mL/min/1.5m2   BUN/Creatinine Ratio 27 (H) 6 - 22 (calc)   Sodium 142 135 - 146 mmol/L   Potassium 4.3 3.5 - 5.3 mmol/L   Chloride 107 98 - 110 mmol/L   CO2 26 20 - 32  mmol/L   Calcium 9.1 8.6 - 10.3 mg/dL   Total Protein 6.6 6.1 - 8.1 g/dL   Albumin 4.3 3.6 - 5.1 g/dL   Globulin 2.3 1.9 - 3.7 g/dL (calc)   AG Ratio 1.9 1.0 - 2.5 (calc)   Total Bilirubin 0.4 0.2 - 1.2 mg/dL   Alkaline phosphatase (APISO) 74 40 - 115 U/L   AST 18 10 - 35 U/L   ALT 23 9 - 46 U/L  Microalbumin / creatinine urine ratio     Status: Abnormal   Collection Time: 06/28/18  8:11 AM  Result Value Ref Range   Creatinine, Urine 83 20 - 320 mg/dL   Microalb, Ur 6.6 mg/dL    Comment: Reference Range Not established    Microalb Creat Ratio 80 (H) <30 mcg/mg creat    Comment: . The ADA defines abnormalities in albumin excretion as follows: Marland Kitchen Category         Result (mcg/mg creatinine) . Normal                    <30 Microalbuminuria         30-299  Clinical albuminuria   > OR = 300 . The ADA recommends that at least two of three specimens collected within a 3-6 month period be abnormal before considering a patient to be within a diagnostic category.   Lipid panel     Status: Abnormal   Collection Time: 06/28/18  8:11 AM  Result Value Ref Range   Cholesterol 121 <200 mg/dL   HDL 37 (L) >40 mg/dL   Triglycerides 72 <150 mg/dL   LDL Cholesterol (Calc) 69 mg/dL (calc)    Comment: Reference range: <100 . Desirable range <100 mg/dL for primary prevention;   <70 mg/dL for patients with CHD or diabetic patients  with > or = 2 CHD risk factors. Marland Kitchen LDL-C is now calculated  using the Martin-Hopkins  calculation, which is a validated novel method providing  better accuracy than the Friedewald equation in the  estimation of LDL-C.  Eric Guzman et al. Annamaria Helling. 6387;564(33): 2061-2068  (http://education.[facility].com/faq/FAQ164)    Total CHOL/HDL Ratio 3.3 <5.0 (calc)   Non-HDL Cholesterol (Calc) 84 <130 mg/dL (calc)    Comment: For patients with diabetes plus 1 major ASCVD risk  factor, treating to a non-HDL-C goal of <100 mg/dL  (LDL-C of <70 mg/dL) is considered a therapeutic  option.   VITAMIN D 25 Hydroxy (Vit-D Deficiency, Fractures)     Status: Abnormal   Collection Time: 06/28/18  8:11 AM  Result Value Ref Range   Vit D, 25-Hydroxy 26 (L) 30 - 100 ng/mL    Comment: Vitamin D Status         25-OH Vitamin D: . Deficiency:                    <20 ng/mL Insufficiency:             20 - 29 ng/mL Optimal:                 > or = 30 ng/mL . For 25-OH Vitamin D testing on patients on  D2-supplementation and patients for whom quantitation  of D2 and D3 fractions is required, the QuestAssureD(TM) 25-OH VIT D, (D2,D3), LC/MS/MS is recommended: order  code [id number] (patients >78yrs). . For more information on this test, go to: http://education.[facility].com/faq/FAQ163 (This link is being provided for  informational/educational purposes only.)   Vitamin B12     Status:  None   Collection Time: 06/28/18  8:11 AM  Result Value Ref Range   Vitamin B-12 729 200 - 1,100 pg/mL  TSH     Status: None   Collection Time: 06/28/18  8:11 AM  Result Value Ref Range   TSH 1.01 0.40 - 4.50 mIU/L  T4, free     Status: None   Collection Time: 06/28/18  8:11 AM  Result Value Ref Range   Free T4 1.2 0.8 - 1.8 ng/dL    On 10/22/2016 his CMP showed normal renal function, islet cell antibodies where undetectable, GAD 65 antibody is also negative.    Assessment & Plan:   1. Uncontrolled type 2 diabetes mellitus  without long-term current use of insulin  ([place])  - Patient has currently uncontrolled symptomatic type 2 DM since  68 years of age. -Patient returns with increasing A1c at 9%, slowly increasing from 7.7%.  He did have A1c as high as 11% in the past.   - Recent labs reviewed.   His diabetes is complicated by retinopathy, nephropathy,   and patient remains at a high risk for more acute and chronic complications of diabetes which include CAD, CVA, CKD, retinopathy, and neuropathy. These are all discussed in detail with the patient.  - I have counseled the patient on diet management , by adopting a carbohydrate restricted/protein rich diet.  -He still admits to dietary indiscretions including consumption of sweetened beverages.  -  Suggestion is made for him to avoid simple carbohydrates  from his diet including Cakes, Sweet Desserts / Pastries, Ice Cream, Soda (diet and regular), Sweet Tea, Candies, Chips, Cookies, Store Bought Juices, Alcohol in Excess of  1-2 drinks a day, Artificial Sweeteners, and "Sugar-free" Products. This will help patient to have stable blood glucose profile and potentially avoid unintended weight gain.  - I encouraged the patient to switch to  unprocessed or minimally processed complex starch and increased protein intake (animal or plant source), fruits, and vegetables.  - Patient is advised to stick to a routine mealtimes to eat 3 meals  a day and avoid unnecessary snacks ( to snack only to correct hypoglycemia).    - I have approached patient with the following individualized plan to manage diabetes and patient agrees:   -Given his progressive loss of control to A1c of 9% and his prior history of A1c as high as 11%, he was approached for initiation of basal insulin, however patient refused this treatment plan  for now.    Per his report he has never been overweight or obese, denies any history of pancreatitis nor heavy alcohol intake. - His presentation is not typical type 2 nor type 1 diabetes. He denies any  history of diabetic ketoacidosis. His GAD 65 antibodies and anti-islet cell antibodies aree undetectable - suggesting the likelihood of type 2 diabetes rather than type 1.  -He is advised to continue metformin  metformin to 500 mg p.o. 3 times daily after breakfast, lunch, and supper. -He is advised to continue Januvia 50 mg p.o. daily, therapeutically suitable for patient.  - Patient is not suitable candidate for Victoza, Byetta, nor other injectable incretin therapy. -  patient specific targets for  A1c;  LDL, HDL, Triglycerides, and  Waist Circumference were discussed in detail.  2) BP/HTN: His blood pressure is controlled to target.   He is advised to continue his current blood pressure medications including lisinopril 20 mg p.o. Daily.  3) Lipids/HPL: His recent lipid panel showed controlled LDL at 58.  He is advised to continue Lipitor 20 mg p.o. nightly.     4)  Weight/Diet: he will benefit from continued CDE Consult, exercise, and detailed carbohydrates information provided.  5) Chronic Care/Health Maintenance:  -Patient is on ACEI/ARB and Statin medications and encouraged to continue to follow up with Ophthalmology, Podiatrist at least yearly or according to recommendations, and advised to  stay away from smoking. I have recommended yearly flu vaccine and pneumonia vaccination at least every 5 years; moderate intensity exercise for up to 150 minutes weekly; and  sleep for at least 7 hours a day.   - I advised patient to maintain close follow up with Sharilyn Sites, MD for primary care needs.  - Time spent with the patient: 25 min, of which >50% was spent in reviewing his  current and  previous labs, previous treatments, and medications doses and developing a plan for long-term care.  Eric Guzman participated in the discussions, expressed understanding, and voiced agreement with the above plans.  All questions were answered to his satisfaction. he is encouraged to contact clinic should  he have any questions or concerns prior to his return visit.  Follow up plan: - Return in about 3 months (around 09/30/2018) for Follow up with Pre-visit Labs.  Glade Lloyd, MD Phone: [phone]  Fax: [phone]  This note was partially dictated with voice recognition software. Similar sounding words can be transcribed inadequately or may not  be corrected upon review.  07/01/2018, 10:45 AM

## 2018-07-01 NOTE — Patient Instructions (Signed)

## 2018-07-11 DIAGNOSIS — E782 Mixed hyperlipidemia: Secondary | ICD-10-CM | POA: Diagnosis not present

## 2018-07-11 DIAGNOSIS — I10 Essential (primary) hypertension: Secondary | ICD-10-CM | POA: Diagnosis not present

## 2018-07-11 DIAGNOSIS — Z1389 Encounter for screening for other disorder: Secondary | ICD-10-CM | POA: Diagnosis not present

## 2018-07-11 DIAGNOSIS — E1165 Type 2 diabetes mellitus with hyperglycemia: Secondary | ICD-10-CM | POA: Diagnosis not present

## 2018-07-11 DIAGNOSIS — Z6825 Body mass index (BMI) 25.0-25.9, adult: Secondary | ICD-10-CM | POA: Diagnosis not present

## 2018-07-11 DIAGNOSIS — E663 Overweight: Secondary | ICD-10-CM | POA: Diagnosis not present

## 2018-08-08 ENCOUNTER — Other Ambulatory Visit: Payer: Self-pay | Admitting: "Endocrinology

## 2018-08-17 ENCOUNTER — Encounter (INDEPENDENT_AMBULATORY_CARE_PROVIDER_SITE_OTHER): Payer: Medicare HMO | Admitting: Ophthalmology

## 2018-08-17 DIAGNOSIS — E11319 Type 2 diabetes mellitus with unspecified diabetic retinopathy without macular edema: Secondary | ICD-10-CM | POA: Diagnosis not present

## 2018-08-17 DIAGNOSIS — H43813 Vitreous degeneration, bilateral: Secondary | ICD-10-CM | POA: Diagnosis not present

## 2018-08-17 DIAGNOSIS — D3132 Benign neoplasm of left choroid: Secondary | ICD-10-CM | POA: Diagnosis not present

## 2018-08-17 DIAGNOSIS — E113393 Type 2 diabetes mellitus with moderate nonproliferative diabetic retinopathy without macular edema, bilateral: Secondary | ICD-10-CM | POA: Diagnosis not present

## 2018-08-17 DIAGNOSIS — I10 Essential (primary) hypertension: Secondary | ICD-10-CM | POA: Diagnosis not present

## 2018-08-17 DIAGNOSIS — E1165 Type 2 diabetes mellitus with hyperglycemia: Secondary | ICD-10-CM | POA: Diagnosis not present

## 2018-08-17 DIAGNOSIS — H35033 Hypertensive retinopathy, bilateral: Secondary | ICD-10-CM

## 2018-08-18 ENCOUNTER — Other Ambulatory Visit: Payer: Self-pay | Admitting: "Endocrinology

## 2018-08-22 ENCOUNTER — Other Ambulatory Visit: Payer: Self-pay

## 2018-08-22 MED ORDER — SITAGLIPTIN PHOSPHATE 50 MG PO TABS: ORAL_TABLET | ORAL | 2 refills | Status: DC

## 2018-10-03 DIAGNOSIS — E1122 Type 2 diabetes mellitus with diabetic chronic kidney disease: Secondary | ICD-10-CM | POA: Diagnosis not present

## 2018-10-03 DIAGNOSIS — E1165 Type 2 diabetes mellitus with hyperglycemia: Secondary | ICD-10-CM | POA: Diagnosis not present

## 2018-10-03 DIAGNOSIS — N183 Chronic kidney disease, stage 3 (moderate): Secondary | ICD-10-CM | POA: Diagnosis not present

## 2018-10-04 LAB — COMPLETE METABOLIC PANEL WITH GFR
AG RATIO: 1.8 (calc) (ref 1.0–2.5)
ALBUMIN MSPROF: 4.3 g/dL (ref 3.6–5.1)
ALKALINE PHOSPHATASE (APISO): 64 U/L (ref 35–144)
ALT: 19 U/L (ref 9–46)
AST: 18 U/L (ref 10–35)
BILIRUBIN TOTAL: 0.5 mg/dL (ref 0.2–1.2)
BUN / CREAT RATIO: 29 (calc) — AB (ref 6–22)
BUN: 37 mg/dL — ABNORMAL HIGH (ref 7–25)
CO2: 26 mmol/L (ref 20–32)
Calcium: 9.1 mg/dL (ref 8.6–10.3)
Chloride: 105 mmol/L (ref 98–110)
Creat: 1.26 mg/dL — ABNORMAL HIGH (ref 0.70–1.25)
GFR, EST AFRICAN AMERICAN: 67 mL/min/{1.73_m2} (ref >=60)
GFR, Est Non African American: 58 mL/min/{1.73_m2} — ABNORMAL LOW (ref >=60)
Globulin: 2.4 g/dL (calc) (ref 1.9–3.7)
Glucose, Bld: 292 mg/dL — ABNORMAL HIGH (ref 65–139)
POTASSIUM: 5 mmol/L (ref 3.5–5.3)
SODIUM: 136 mmol/L (ref 135–146)
TOTAL PROTEIN: 6.7 g/dL (ref 6.1–8.1)

## 2018-10-04 LAB — HEMOGLOBIN A1C
EAG (MMOL/L): 9.3 (calc)
HEMOGLOBIN A1C: 7.5 %{Hb} — AB (ref <5.7)
MEAN PLASMA GLUCOSE: 169 (calc)

## 2018-10-07 ENCOUNTER — Encounter: Payer: Self-pay | Admitting: "Endocrinology

## 2018-10-07 ENCOUNTER — Other Ambulatory Visit: Payer: Self-pay

## 2018-10-07 ENCOUNTER — Ambulatory Visit (INDEPENDENT_AMBULATORY_CARE_PROVIDER_SITE_OTHER): Payer: Medicare HMO | Admitting: "Endocrinology

## 2018-10-07 DIAGNOSIS — E1165 Type 2 diabetes mellitus with hyperglycemia: Secondary | ICD-10-CM

## 2018-10-07 DIAGNOSIS — E782 Mixed hyperlipidemia: Secondary | ICD-10-CM

## 2018-10-07 DIAGNOSIS — N183 Chronic kidney disease, stage 3 (moderate): Secondary | ICD-10-CM

## 2018-10-07 DIAGNOSIS — E1122 Type 2 diabetes mellitus with diabetic chronic kidney disease: Secondary | ICD-10-CM

## 2018-10-07 DIAGNOSIS — I10 Essential (primary) hypertension: Secondary | ICD-10-CM

## 2018-10-07 DIAGNOSIS — IMO0002 Reserved for concepts with insufficient information to code with codable children: Secondary | ICD-10-CM

## 2018-10-07 NOTE — Progress Notes (Signed)
Endocrinology Telephone Visit Follow up Note -During COVID -19 Pandemic    Subjective:    Patient ID: Eric Guzman, male    DOB: [birth year]. Patient is being engaged in telephone visit in the management of type 2 diabetes, hyperlipidemia, hypertension.   PMD:   Sharilyn Sites, MD  Past Medical History:  Diagnosis Date  . Anemia   . Diabetes ([place])   . Foley catheter in place 07/21/2016  . Hx of duodenal ulcer 2013  . Hypercholesteremia   . Hypertension    Past Surgical History:  Procedure Laterality Date  . COLONOSCOPY  12/15/2004   RMR:.  Anal papilla.  Otherwise, normal rectum and colon  . COLONOSCOPY  Oct 2011   Dr. Gala Romney: normal  . COLONOSCOPY N/A 04/11/2014   Dr. Gala Romney: Anal papilla and internal hemorroids; otherwise normal ileocolonoscopy  . ESOPHAGOGASTRODUODENOSCOPY N/A 04/11/2014   Dr. Gala Romney: erosive reflux esophagitis.  Gastric ulcer- status post biopsy. duodenal erosions-status post biopsy, negative H.pylori  . ESOPHAGOGASTRODUODENOSCOPY N/A 08/08/2014   Procedure: ESOPHAGOGASTRODUODENOSCOPY (EGD);  Surgeon: Daneil Dolin, MD;  Location: [facility];  Service: Endoscopy;  Laterality: N/A;  1000am  . PROSTATE BIOPSY N/A 09/01/2016   Procedure: BIOPSY TRANSRECTAL ULTRASONIC PROSTATE (TUBP);  Surgeon: Irine Seal, MD;  Location: [facility];  Service: Urology;  Laterality: N/A;  . THULIUM LASER TURP (TRANSURETHRAL RESECTION OF PROSTATE) N/A 09/01/2016   Procedure: THULIUM LASER TURP (TRANSURETHRAL RESECTION OF PROSTATE);  Surgeon: Irine Seal, MD;  Location: [facility];  Service: Urology;  Laterality: N/A;   Social History   Socioeconomic History  . Marital status: Widowed    Spouse name: Not on file  . Number of children: Not on file  . Years of education: Not on file  . Highest education level: Not on file  Occupational History  . Occupation: retired    Comment: Insurance claims handler  . Financial resource strain: Not on file  . Food insecurity:    Worry: Not on file    Inability: Not on file  . Transportation needs:    Medical: Not on file    Non-medical: Not on file  Tobacco Use  . Smoking status: Never Smoker  . Smokeless tobacco: Never Used  Substance and Sexual Activity  . Alcohol use: No    Alcohol/week: 0.0 standard drinks  . Drug use: No  . Sexual activity: Not on file  Lifestyle  . Physical activity:    Days per week: Not on file    Minutes per session: Not on file  . Stress: Not on file  Relationships  . Social connections:    Talks on phone: Not on file    Gets together: Not on file    Attends religious service: Not on file    Active member of club or organization: Not on file    Attends meetings of clubs or organizations: Not on file    Relationship status: Not on file  Other Topics Concern  . Not on file  Social History Narrative  . Not on file   Outpatient Encounter Medications as of 10/07/2018  Medication Sig  . atorvastatin (LIPITOR) 20 MG tablet Take 20 mg  by mouth daily.  Marland Kitchen JANUVIA 50 MG tablet TAKE ONE (1) TABLET BY MOUTH EVERY DAY  . lisinopril (PRINIVIL,ZESTRIL) 20 MG tablet TAKE ONE (1) TABLET BY MOUTH EVERY DAY  . metFORMIN (GLUCOPHAGE) 1000 MG tablet Take 1,000 mg by mouth 3 (three) times daily.  . Multiple Vitamin (MULTIVITAMIN) tablet Take 1 tablet by mouth daily. WITH IRON  . pantoprazole (PROTONIX) 40 MG tablet Take 1 tablet (40 mg total) by mouth daily.  . sitaGLIPtin (JANUVIA) 50 MG tablet TAKE ONE (1) TABLET BY MOUTH EVERY DAY   No facility-administered encounter medications on file as of 10/07/2018.    ALLERGIES: No Known Allergies VACCINATION STATUS:  There is no immunization history on file for this patient.  Diabetes  He presents for his follow-up diabetic visit. He has type 2 diabetes mellitus. Onset time: He was diagnosed at approximate age of 15 years. His disease course has been improving.  There are no hypoglycemic associated symptoms. Pertinent negatives for hypoglycemia include no confusion, headaches, pallor or seizures. There are no diabetic associated symptoms. Pertinent negatives for diabetes include no chest pain, no fatigue, no polydipsia, no polyphagia, no polyuria and no weakness. There are no hypoglycemic complications. Symptoms are improving. Diabetic complications include nephropathy and retinopathy. Risk factors for coronary artery disease include diabetes mellitus, dyslipidemia, hypertension, male sex and tobacco exposure. Current diabetic treatment includes oral agent (dual therapy) (He is taking Januvia 100 mg by mouth twice a day and glimepiride 4 mg by mouth twice a day). He is following a generally unhealthy diet. When asked about meal planning, he reported none. He has not had a previous visit with a dietitian. He participates in exercise intermittently. An ACE inhibitor/angiotensin II receptor blocker is not being taken. Eye exam is current (Patient has history of retinopathy status post laser therapy.).  Hyperlipidemia  This is a chronic problem. The current episode started more than 1 year ago. The problem is uncontrolled. Recent lipid tests were reviewed and are variable. Exacerbating diseases include diabetes. Pertinent negatives include no chest pain, myalgias or shortness of breath. Current antihyperlipidemic treatment includes statins. Risk factors for coronary artery disease include dyslipidemia, diabetes mellitus, hypertension, male sex and a sedentary lifestyle.  Hypertension  This is a chronic problem. The current episode started more than 1 year ago. The problem is uncontrolled. Pertinent negatives include no chest pain, headaches, neck pain, palpitations or shortness of breath. Risk factors for coronary artery disease include family history, diabetes mellitus, sedentary lifestyle and smoking/tobacco exposure. Past treatments include nothing. Hypertensive  end-organ damage includes kidney disease and retinopathy.     Objective:    There were no vitals taken for this visit.  Wt Readings from Last 3 Encounters:  07/01/18 156 lb (70.8 kg)  03/31/18 152 lb (68.9 kg)  12/01/17 157 lb (71.2 kg)      Recent Results (from the past 2160 hour(s))  Hemoglobin A1c     Status: Abnormal   Collection Time: 10/03/18 10:39 AM  Result Value Ref Range   Hgb A1c MFr Bld 7.5 (H) <5.7 % of total Hgb    Comment: For someone without known diabetes, a hemoglobin A1c value of 6.5% or greater indicates that they may have  diabetes and this should be confirmed with a follow-up  test. . For someone with known diabetes, a value <7% indicates  that their diabetes is well controlled and a value  greater than or equal to 7% indicates suboptimal  control. A1c targets should be individualized based on  duration of diabetes, age, comorbid conditions, and  other considerations. . Currently, no consensus exists regarding use of hemoglobin A1c for diagnosis of diabetes for children. .    Mean Plasma Glucose 169 (calc)   eAG (mmol/L) 9.3 (calc)  COMPLETE METABOLIC PANEL WITH GFR     Status: Abnormal   Collection Time: 10/03/18 10:39 AM  Result Value Ref Range   Glucose, Bld 292 (H) 65 - 139 mg/dL    Comment: .        Non-fasting reference interval .    BUN 37 (H) 7 - 25 mg/dL   Creat 1.26 (H) 0.70 - 1.25 mg/dL    Comment: For patients >2 years of age, the reference limit for Creatinine is approximately 13% higher for people identified as African-American. .    GFR, Est Non African American 58 (L) > OR = 60 mL/min/1.65m2   GFR, Est African American 67 > OR = 60 mL/min/1.27m2   BUN/Creatinine Ratio 29 (H) 6 - 22 (calc)   Sodium 136 135 - 146 mmol/L   Potassium 5.0 3.5 - 5.3 mmol/L   Chloride 105 98 - 110 mmol/L   CO2 26 20 - 32 mmol/L   Calcium 9.1 8.6 - 10.3 mg/dL   Total Protein 6.7 6.1 - 8.1 g/dL   Albumin 4.3 3.6 - 5.1 g/dL   Globulin 2.4 1.9  - 3.7 g/dL (calc)   AG Ratio 1.8 1.0 - 2.5 (calc)   Total Bilirubin 0.5 0.2 - 1.2 mg/dL   Alkaline phosphatase (APISO) 64 35 - 144 U/L   AST 18 10 - 35 U/L   ALT 19 9 - 46 U/L    On 10/22/2016 his CMP showed normal renal function, islet cell antibodies where undetectable, GAD 65 antibody is also negative.    Assessment & Plan:   1. Uncontrolled type 2 diabetes mellitus  without long-term current use of insulin ([place])  - Patient has currently uncontrolled symptomatic type 2 DM since  68 years of age. -His previsit labs show A1c of 7.5% improving from 9%.  He reports blood glucose average of 150 over the last 10 days.  - Recent labs reviewed.   His diabetes is complicated by retinopathy, nephropathy,   and patient remains at a high risk for more acute and chronic complications of diabetes which include CAD, CVA, CKD, retinopathy, and neuropathy. These are all discussed in detail with the patient.  - I have counseled the patient on diet management , by adopting a carbohydrate restricted/protein rich diet.  - Patient admits there is a room for improvement in his diet and drink choices. -  Suggestion is made for him to avoid simple carbohydrates  from his diet including Cakes, Sweet Desserts / Pastries, Ice Cream, Soda (diet and regular), Sweet Tea, Candies, Chips, Cookies, Store Bought Juices, Alcohol in Excess of  1-2 drinks a day, Artificial Sweeteners, and "Sugar-free" Products. This will help patient to have stable blood glucose profile and potentially avoid unintended weight gain.   - I encouraged the patient to switch to  unprocessed or minimally processed complex starch and increased protein intake (animal or plant source), fruits, and vegetables.  - Patient is advised to stick to a routine mealtimes to eat 3 meals  a day and avoid unnecessary snacks ( to snack only to correct hypoglycemia).    - I have approached patient with the following individualized plan to manage diabetes  and patient agrees:   -He has achieved reasonable control of diabetes since  last visit with A1c of 7.5%, will not need insulin treatment for now.   Per his report he has never been overweight or obese, denies any history of pancreatitis nor heavy alcohol intake. - His presentation is not typical type 2 nor type 1 diabetes. He denies any history of diabetic ketoacidosis. His GAD 65 antibodies and anti-islet cell antibodies aree undetectable - suggesting the likelihood of type 2 diabetes rather than type 1.  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  He is advised to maintain adequate hydration.  Given his hesitancy to go on insulin treatment, he is going to continue metformin 500 mg p.o. 3 times daily after breakfast, lunch, and supper.   -He is advised to continue Januvia 50 mg p.o. daily, therapeutically suitable for patient.  - Patient is not suitable candidate for Victoza, Byetta, nor other injectable incretin therapy.   2) BP/HTN: He is advised to measure his blood pressure at least once a week and report if it is greater than 140/90.    He is advised to continue his current blood pressure medications including lisinopril 20 mg p.o. Daily.  3) Lipids/HPL: His recent lipid panel showed controlled LDL at 58.  He is advised to continue Lipitor 20 mg p.o. nightly. .      - I advised patient to maintain close follow up with Sharilyn Sites, MD for primary care needs.  - Patient Care Time Today:  25 min, of which >50% was spent in reviewing his  current and  previous labs/studies, his blood glucose readings, previous treatments, and medications doses and developing a plan for long-term care based on the latest recommendations for standards of care.  Eric Guzman participated in the discussions, expressed understanding, and voiced agreement with the above plans.  All questions were answered to his satisfaction. he is encouraged to contact clinic should he have any  questions or concerns prior to his return visit.  Follow up plan: - Return in about 6 months (around 04/08/2019) for Follow up with Pre-visit Labs.  Glade Lloyd, MD Phone: [phone]  Fax: [phone]  This note was partially dictated with voice recognition software. Similar sounding words can be transcribed inadequately or may not  be corrected upon review.  10/07/2018, 10:57 AM

## 2018-10-12 ENCOUNTER — Other Ambulatory Visit: Payer: Self-pay | Admitting: "Endocrinology

## 2018-10-15 IMAGING — US US EXTREM LOW VENOUS*L*
1 series · 14 of 24 positions shown · non-contrast
Comparison: None

CLINICAL DATA: Edema, pain.  Varicose veins.

EXAM:
LEFT LOWER EXTREMITY VENOUS DOPPLER ULTRASOUND
TECHNIQUE: Gray-scale sonography with compression, as well as color and duplex
ultrasound, were performed to evaluate the deep venous system from
the level of the common femoral vein through the popliteal and
proximal calf veins.

[Series 1: us extrem low venous*left* · 0.08mm/px · 14 of 36 slices shown]
[im 1/36]
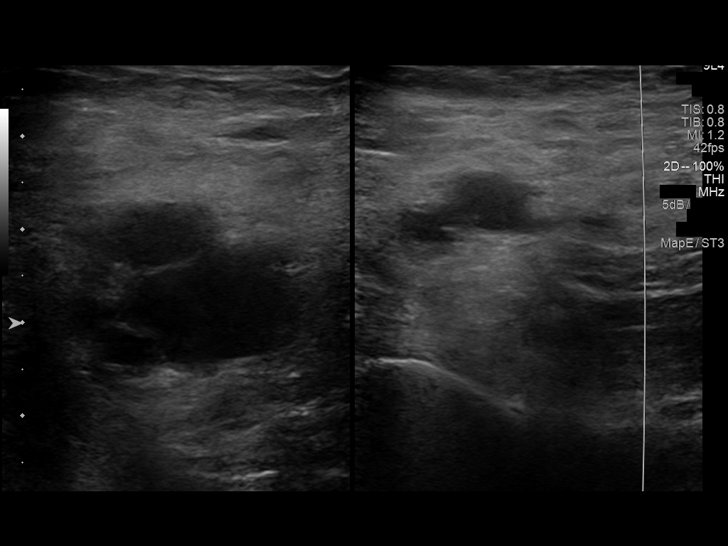
[im 4/36]
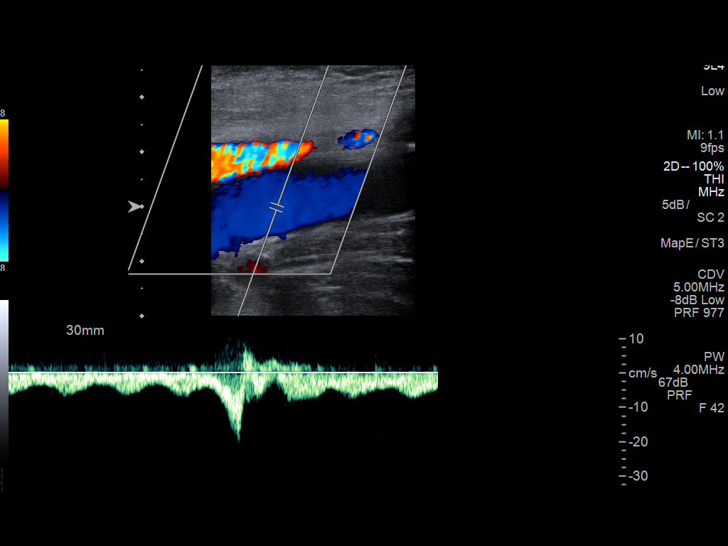
[im 7/36]
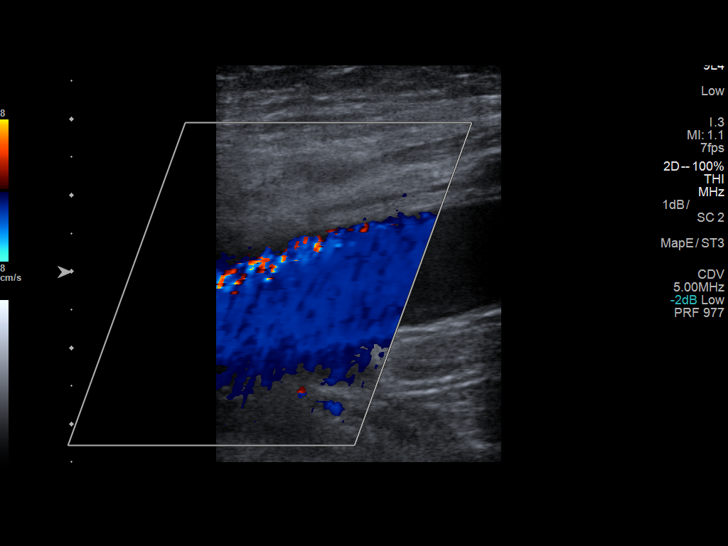
[im 10/36]
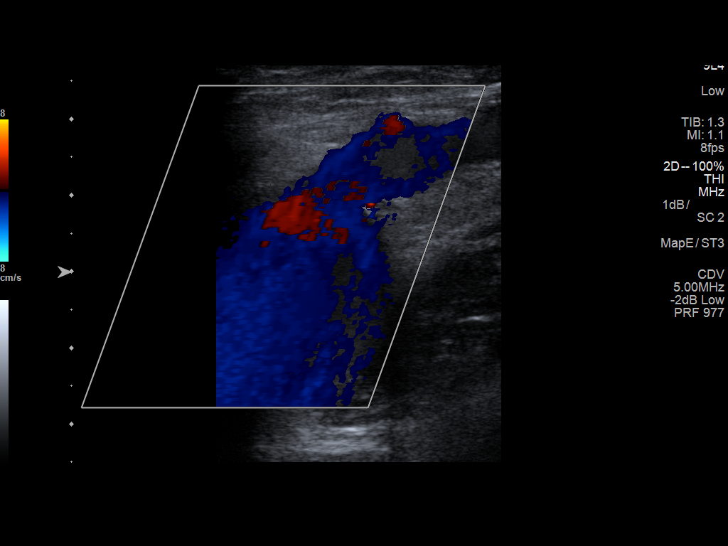
[im 11/36]
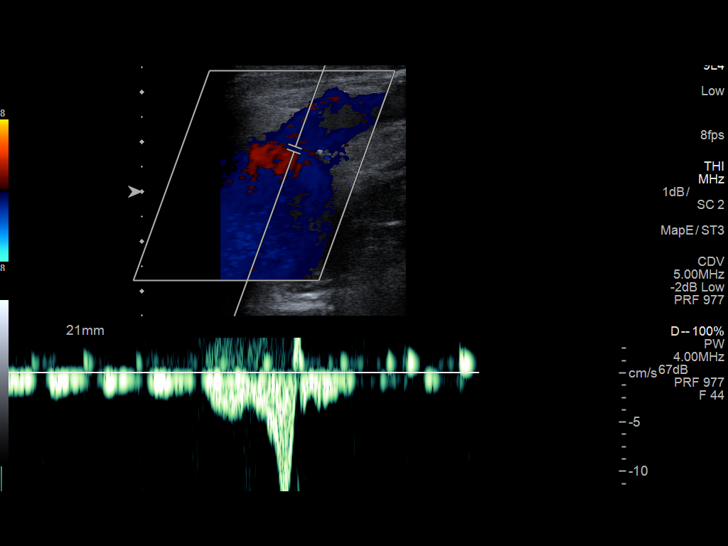
[im 14/36]
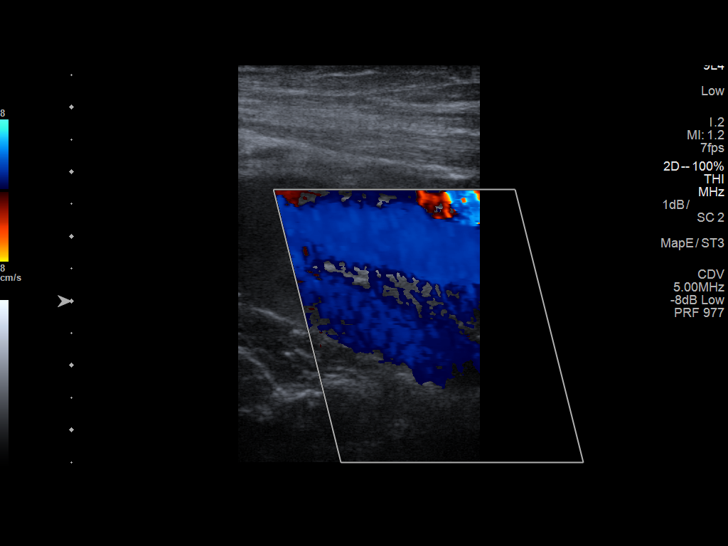
[im 17/36]
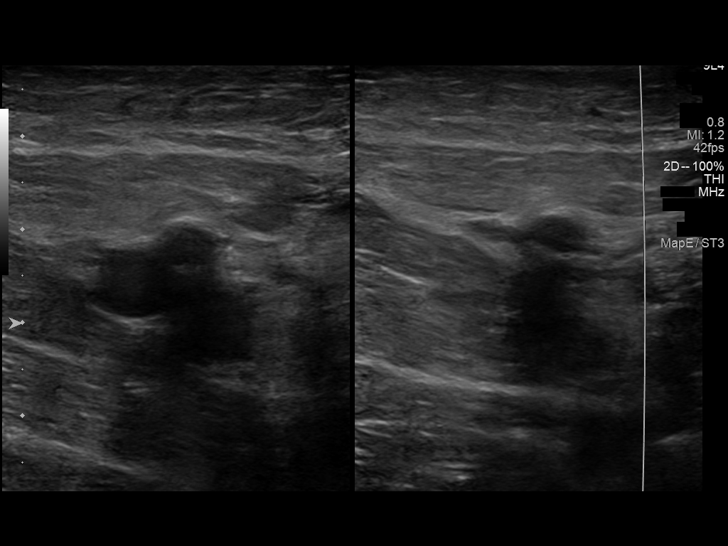
[im 19/36]
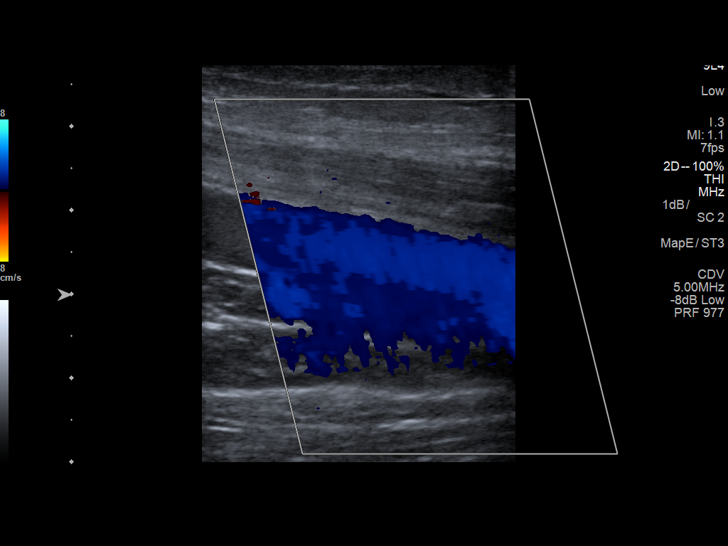
[im 22/36]
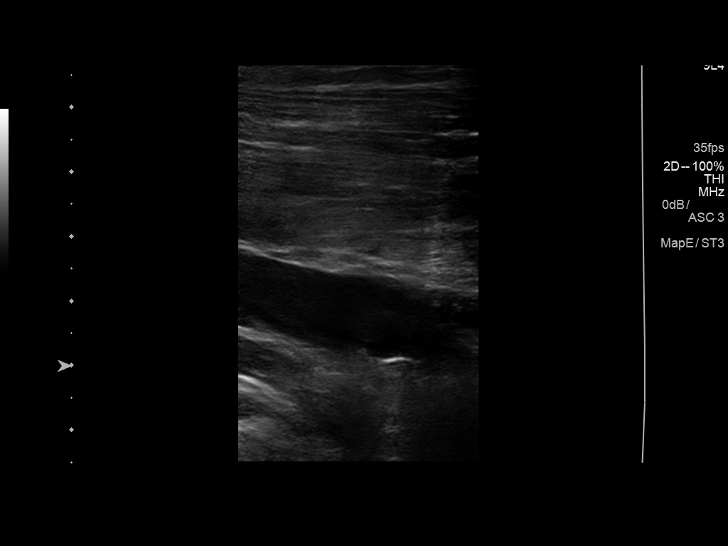
[im 25/36]
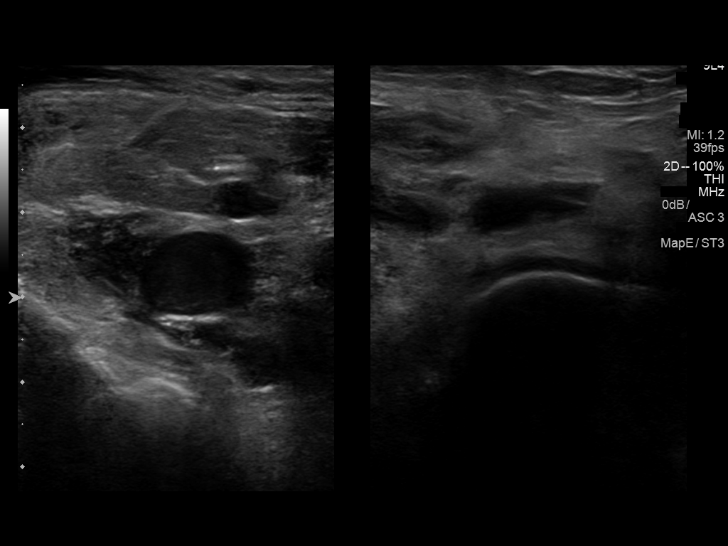
[im 28/36]
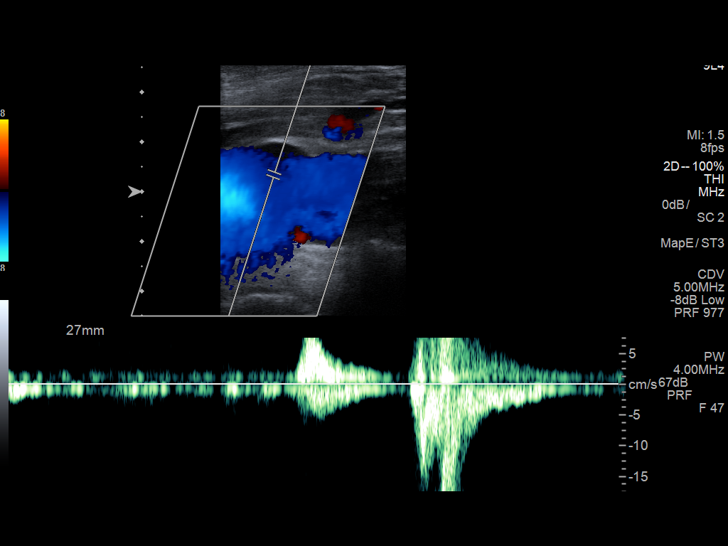
[im 29/36]
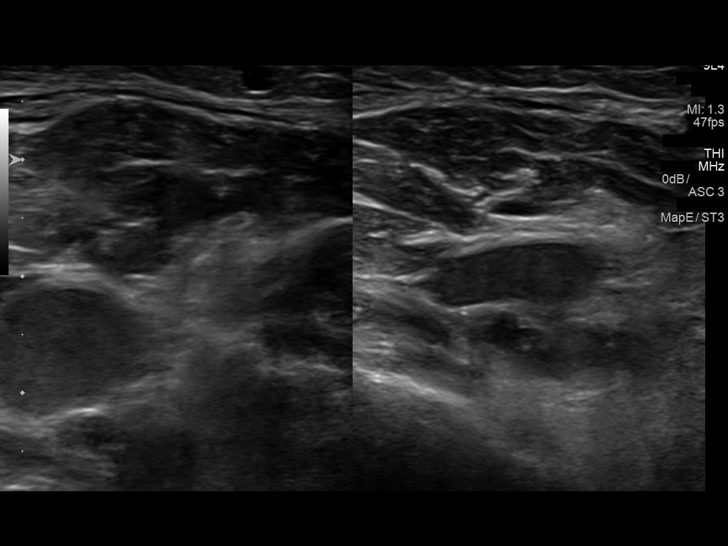
[im 32/36]
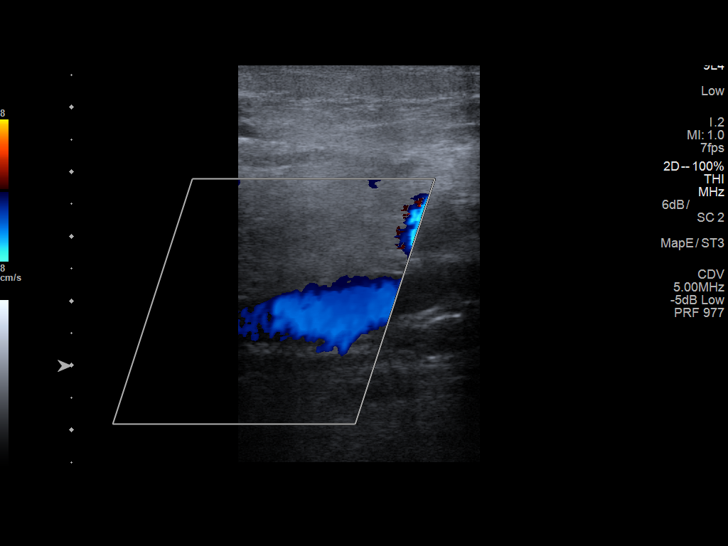
[im 36/36]
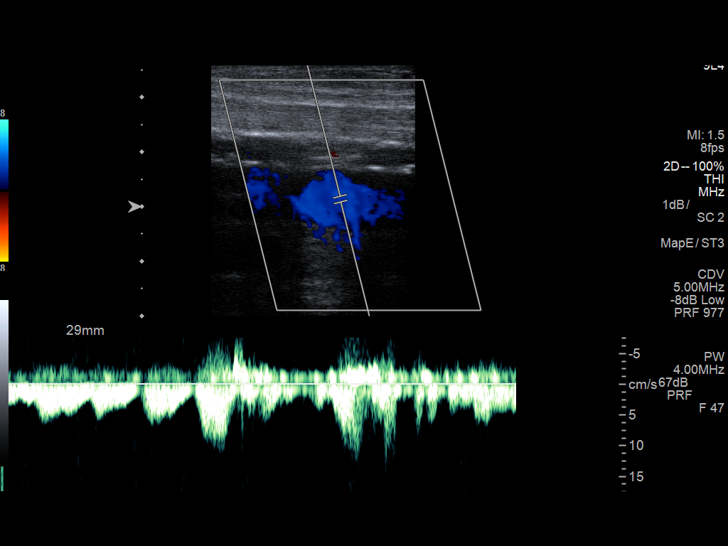

[14 of 24 positions shown; findings below may reference images not displayed]

FINDINGS: Normal compressibility of the common femoral, superficial femoral,
and popliteal veins, as well as the proximal calf veins. No filling
defects to suggest DVT on grayscale or color Doppler imaging.
Doppler waveforms show normal direction of venous flow, normal
respiratory phasicity and response to augmentation. Survey views of
the contralateral common femoral vein are unremarkable.
IMPRESSION: No evidence of  lower extremity deep vein thrombosis, left.

## 2018-11-07 ENCOUNTER — Other Ambulatory Visit: Payer: Self-pay | Admitting: "Endocrinology

## 2019-01-16 DIAGNOSIS — E1165 Type 2 diabetes mellitus with hyperglycemia: Secondary | ICD-10-CM | POA: Diagnosis not present

## 2019-01-16 DIAGNOSIS — Z6824 Body mass index (BMI) 24.0-24.9, adult: Secondary | ICD-10-CM | POA: Diagnosis not present

## 2019-01-16 DIAGNOSIS — I10 Essential (primary) hypertension: Secondary | ICD-10-CM | POA: Diagnosis not present

## 2019-01-16 DIAGNOSIS — E7849 Other hyperlipidemia: Secondary | ICD-10-CM | POA: Diagnosis not present

## 2019-01-16 DIAGNOSIS — Z1389 Encounter for screening for other disorder: Secondary | ICD-10-CM | POA: Diagnosis not present

## 2019-01-24 DIAGNOSIS — R3129 Other microscopic hematuria: Secondary | ICD-10-CM | POA: Diagnosis not present

## 2019-01-24 DIAGNOSIS — N200 Calculus of kidney: Secondary | ICD-10-CM | POA: Diagnosis not present

## 2019-01-24 DIAGNOSIS — R3121 Asymptomatic microscopic hematuria: Secondary | ICD-10-CM | POA: Diagnosis not present

## 2019-01-24 DIAGNOSIS — Z6824 Body mass index (BMI) 24.0-24.9, adult: Secondary | ICD-10-CM | POA: Diagnosis not present

## 2019-02-07 ENCOUNTER — Other Ambulatory Visit: Payer: Self-pay | Admitting: "Endocrinology

## 2019-02-15 ENCOUNTER — Other Ambulatory Visit: Payer: Self-pay

## 2019-02-15 ENCOUNTER — Encounter (INDEPENDENT_AMBULATORY_CARE_PROVIDER_SITE_OTHER): Payer: Medicare HMO | Admitting: Ophthalmology

## 2019-02-15 DIAGNOSIS — E11319 Type 2 diabetes mellitus with unspecified diabetic retinopathy without macular edema: Secondary | ICD-10-CM

## 2019-02-15 DIAGNOSIS — E113393 Type 2 diabetes mellitus with moderate nonproliferative diabetic retinopathy without macular edema, bilateral: Secondary | ICD-10-CM

## 2019-02-15 DIAGNOSIS — I10 Essential (primary) hypertension: Secondary | ICD-10-CM

## 2019-02-15 DIAGNOSIS — H35033 Hypertensive retinopathy, bilateral: Secondary | ICD-10-CM | POA: Diagnosis not present

## 2019-02-15 DIAGNOSIS — H2513 Age-related nuclear cataract, bilateral: Secondary | ICD-10-CM

## 2019-02-15 DIAGNOSIS — H43813 Vitreous degeneration, bilateral: Secondary | ICD-10-CM | POA: Diagnosis not present

## 2019-02-20 DIAGNOSIS — E119 Type 2 diabetes mellitus without complications: Secondary | ICD-10-CM | POA: Diagnosis not present

## 2019-02-20 DIAGNOSIS — E782 Mixed hyperlipidemia: Secondary | ICD-10-CM | POA: Diagnosis not present

## 2019-02-20 DIAGNOSIS — I10 Essential (primary) hypertension: Secondary | ICD-10-CM | POA: Diagnosis not present

## 2019-02-20 DIAGNOSIS — R972 Elevated prostate specific antigen [PSA]: Secondary | ICD-10-CM | POA: Diagnosis not present

## 2019-03-02 DIAGNOSIS — H5200 Hypermetropia, unspecified eye: Secondary | ICD-10-CM | POA: Diagnosis not present

## 2019-03-22 DIAGNOSIS — E119 Type 2 diabetes mellitus without complications: Secondary | ICD-10-CM | POA: Diagnosis not present

## 2019-03-22 DIAGNOSIS — I10 Essential (primary) hypertension: Secondary | ICD-10-CM | POA: Diagnosis not present

## 2019-04-05 DIAGNOSIS — E1122 Type 2 diabetes mellitus with diabetic chronic kidney disease: Secondary | ICD-10-CM | POA: Diagnosis not present

## 2019-04-05 DIAGNOSIS — E1165 Type 2 diabetes mellitus with hyperglycemia: Secondary | ICD-10-CM | POA: Diagnosis not present

## 2019-04-05 DIAGNOSIS — N183 Chronic kidney disease, stage 3 unspecified: Secondary | ICD-10-CM | POA: Diagnosis not present

## 2019-04-06 LAB — COMPLETE METABOLIC PANEL WITH GFR
AG Ratio: 1.8 (calc) (ref 1.0–2.5)
ALT: 20 U/L (ref 9–46)
AST: 18 U/L (ref 10–35)
Albumin: 4.3 g/dL (ref 3.6–5.1)
Alkaline phosphatase (APISO): 54 U/L (ref 35–144)
BUN/Creatinine Ratio: 25 (calc) — ABNORMAL HIGH (ref 6–22)
BUN: 30 mg/dL — ABNORMAL HIGH (ref 7–25)
CO2: 26 mmol/L (ref 20–32)
Calcium: 9.3 mg/dL (ref 8.6–10.3)
Chloride: 106 mmol/L (ref 98–110)
Creat: 1.18 mg/dL (ref 0.70–1.18)
GFR, Est African American: 72 mL/min/{1.73_m2} (ref >=60)
GFR, Est Non African American: 62 mL/min/{1.73_m2} (ref >=60)
Globulin: 2.4 g/dL (calc) (ref 1.9–3.7)
Glucose, Bld: 165 mg/dL — ABNORMAL HIGH (ref 65–99)
Potassium: 5.3 mmol/L (ref 3.5–5.3)
Sodium: 140 mmol/L (ref 135–146)
Total Bilirubin: 0.5 mg/dL (ref 0.2–1.2)
Total Protein: 6.7 g/dL (ref 6.1–8.1)

## 2019-04-06 LAB — HEMOGLOBIN A1C
Hgb A1c MFr Bld: 7.1 % of total Hgb — ABNORMAL HIGH (ref <5.7)
Mean Plasma Glucose: 157 (calc)
eAG (mmol/L): 8.7 (calc)

## 2019-04-11 ENCOUNTER — Encounter: Payer: Self-pay | Admitting: "Endocrinology

## 2019-04-11 ENCOUNTER — Ambulatory Visit (INDEPENDENT_AMBULATORY_CARE_PROVIDER_SITE_OTHER): Payer: Medicare HMO | Admitting: "Endocrinology

## 2019-04-11 ENCOUNTER — Other Ambulatory Visit: Payer: Self-pay

## 2019-04-11 DIAGNOSIS — IMO0002 Reserved for concepts with insufficient information to code with codable children: Secondary | ICD-10-CM

## 2019-04-11 DIAGNOSIS — N183 Chronic kidney disease, stage 3 unspecified: Secondary | ICD-10-CM

## 2019-04-11 DIAGNOSIS — E1165 Type 2 diabetes mellitus with hyperglycemia: Secondary | ICD-10-CM | POA: Diagnosis not present

## 2019-04-11 DIAGNOSIS — E782 Mixed hyperlipidemia: Secondary | ICD-10-CM

## 2019-04-11 DIAGNOSIS — E1122 Type 2 diabetes mellitus with diabetic chronic kidney disease: Secondary | ICD-10-CM

## 2019-04-11 DIAGNOSIS — I10 Essential (primary) hypertension: Secondary | ICD-10-CM | POA: Diagnosis not present

## 2019-04-11 NOTE — Progress Notes (Signed)
04/11/2019                                                    Endocrinology Telehealth Visit Follow up Note -During COVID -19 Pandemic  This visit type was conducted due to national recommendations for restrictions regarding the COVID-19 Pandemic  in an effort to limit this patient's exposure and mitigate transmission of the corona virus.  Due to his co-morbid illnesses, Eric Guzman is at  moderate to high risk for complications without adequate follow up.  This format is felt to be most appropriate for him at this time.  I connected with this patient on 04/11/2019   by telephone and verified that I am speaking with the correct person using two identifiers. Eric Guzman, Sep 18, 1950. he has verbally consented to this visit. All issues noted in this document were discussed and addressed. The format was not optimal for physical exam.   Subjective:    Patient ID: Eric Guzman, male    DOB: [birth year]. Patient is being engaged in telehealth via telephone in follow-up  in the management of type 2 diabetes, hyperlipidemia, hypertension.   PMD:   Sharilyn Sites, MD  Past Medical History:  Diagnosis Date  . Anemia   . Diabetes ([place])   . Foley catheter in place 07/21/2016  . Hx of duodenal ulcer 2013  . Hypercholesteremia   . Hypertension    Past Surgical History:  Procedure Laterality Date  . COLONOSCOPY  12/15/2004   RMR:.  Anal papilla.  Otherwise, normal rectum and colon  . COLONOSCOPY  Oct 2011   Dr. Gala Romney: normal  . COLONOSCOPY N/A 04/11/2014   Dr. Gala Romney: Anal papilla and internal hemorroids; otherwise normal ileocolonoscopy  . ESOPHAGOGASTRODUODENOSCOPY N/A 04/11/2014   Dr. Gala Romney: erosive reflux esophagitis.  Gastric ulcer- status post biopsy. duodenal erosions-status post biopsy, negative H.pylori  . ESOPHAGOGASTRODUODENOSCOPY N/A 08/08/2014   Procedure: ESOPHAGOGASTRODUODENOSCOPY (EGD);  Surgeon: Daneil Dolin, MD;  Location: [facility];  Service:  Endoscopy;  Laterality: N/A;  1000am  . PROSTATE BIOPSY N/A 09/01/2016   Procedure: BIOPSY TRANSRECTAL ULTRASONIC PROSTATE (TUBP);  Surgeon: Irine Seal, MD;  Location: [facility];  Service: Urology;  Laterality: N/A;  . THULIUM LASER TURP (TRANSURETHRAL RESECTION OF PROSTATE) N/A 09/01/2016   Procedure: THULIUM LASER TURP (TRANSURETHRAL RESECTION OF PROSTATE);  Surgeon: Irine Seal, MD;  Location: [facility];  Service: Urology;  Laterality: N/A;   Social History   Socioeconomic History  . Marital status: Widowed    Spouse name: Not on file  . Number of children: Not on file  . Years of education: Not on file  . Highest education level: Not on file  Occupational History  . Occupation: retired    Comment: Physiological scientist  . Financial resource strain: Not on file  . Food insecurity    Worry: Not on file    Inability: Not on file  . Transportation needs    Medical: Not on file    Non-medical: Not on file  Tobacco Use  . Smoking status: Never Smoker  . Smokeless tobacco: Never Used  Substance and Sexual Activity  . Alcohol use: No    Alcohol/week: 0.0 standard drinks  .  Drug use: No  . Sexual activity: Not on file  Lifestyle  . Physical activity    Days per week: Not on file    Minutes per session: Not on file  . Stress: Not on file  Relationships  . Social Herbalist on phone: Not on file    Gets together: Not on file    Attends religious service: Not on file    Active member of club or organization: Not on file    Attends meetings of clubs or organizations: Not on file    Relationship status: Not on file  Other Topics Concern  . Not on file  Social History Narrative  . Not on file   Outpatient Encounter Medications as of 04/11/2019  Medication Sig  . atorvastatin (LIPITOR) 20 MG tablet Take 20 mg by mouth daily.  Marland Kitchen lisinopril (ZESTRIL) 20 MG tablet TAKE ONE (1) TABLET BY MOUTH EVERY DAY  . metFORMIN (GLUCOPHAGE) 1000  MG tablet TAKE ONE TABLET BY MOUTH TWICE A DAY  . Multiple Vitamin (MULTIVITAMIN) tablet Take 1 tablet by mouth daily. WITH IRON  . pantoprazole (PROTONIX) 40 MG tablet Take 1 tablet (40 mg total) by mouth daily.  . sitaGLIPtin (JANUVIA) 50 MG tablet TAKE ONE (1) TABLET BY MOUTH EVERY DAY  . [DISCONTINUED] JANUVIA 50 MG tablet TAKE ONE (1) TABLET BY MOUTH EVERY DAY   No facility-administered encounter medications on file as of 04/11/2019.    ALLERGIES: No Known Allergies VACCINATION STATUS:  There is no immunization history on file for this patient.  Diabetes He presents for his follow-up diabetic visit. He has type 2 diabetes mellitus. Onset time: He was diagnosed at approximate age of 67 years. His disease course has been improving. There are no hypoglycemic associated symptoms. Pertinent negatives for hypoglycemia include no confusion, headaches, pallor or seizures. There are no diabetic associated symptoms. Pertinent negatives for diabetes include no chest pain, no fatigue, no polydipsia, no polyphagia, no polyuria and no weakness. There are no hypoglycemic complications. Symptoms are improving. Diabetic complications include nephropathy and retinopathy. Risk factors for coronary artery disease include diabetes mellitus, dyslipidemia, hypertension, male sex and tobacco exposure. Current diabetic treatment includes oral agent (dual therapy) (He is taking Januvia 100 mg by mouth twice a day and glimepiride 4 mg by mouth twice a day). His weight is stable. He is following a generally unhealthy diet. When asked about meal planning, he reported none. He has not had a previous visit with a dietitian. He participates in exercise intermittently. An ACE inhibitor/angiotensin II receptor blocker is not being taken. Eye exam is current (Patient has history of retinopathy status post laser therapy.).  Hyperlipidemia This is a chronic problem. The current episode started more than 1 year ago. The problem is  uncontrolled. Recent lipid tests were reviewed and are variable. Exacerbating diseases include diabetes. Pertinent negatives include no chest pain, myalgias or shortness of breath. Current antihyperlipidemic treatment includes statins. Risk factors for coronary artery disease include dyslipidemia, diabetes mellitus, hypertension, male sex and a sedentary lifestyle.  Hypertension This is a chronic problem. The current episode started more than 1 year ago. The problem is uncontrolled. Pertinent negatives include no chest pain, headaches, neck pain, palpitations or shortness of breath. Risk factors for coronary artery disease include family history, diabetes mellitus, sedentary lifestyle and smoking/tobacco exposure. Past treatments include nothing. Hypertensive end-organ damage includes kidney disease and retinopathy.     Objective:    There were no vitals taken for this  visit.  Wt Readings from Last 3 Encounters:  07/01/18 156 lb (70.8 kg)  03/31/18 152 lb (68.9 kg)  12/01/17 157 lb (71.2 kg)      Recent Results (from the past 2160 hour(s))  Hemoglobin A1c     Status: Abnormal   Collection Time: 04/05/19  8:13 AM  Result Value Ref Range   Hgb A1c MFr Bld 7.1 (H) <5.7 % of total Hgb    Comment: For someone without known diabetes, a hemoglobin A1c value of 6.5% or greater indicates that they may have  diabetes and this should be confirmed with a follow-up  test. . For someone with known diabetes, a value <7% indicates  that their diabetes is well controlled and a value  greater than or equal to 7% indicates suboptimal  control. A1c targets should be individualized based on  duration of diabetes, age, comorbid conditions, and  other considerations. . Currently, no consensus exists regarding use of hemoglobin A1c for diagnosis of diabetes for children. .    Mean Plasma Glucose 157 (calc)   eAG (mmol/L) 8.7 (calc)  COMPLETE METABOLIC PANEL WITH GFR     Status: Abnormal   Collection  Time: 04/05/19  8:13 AM  Result Value Ref Range   Glucose, Bld 165 (H) 65 - 99 mg/dL    Comment: .            Fasting reference interval . For someone without known diabetes, a glucose value >125 mg/dL indicates that they may have diabetes and this should be confirmed with a follow-up test. .    BUN 30 (H) 7 - 25 mg/dL   Creat 1.18 0.70 - 1.18 mg/dL    Comment: For patients >72 years of age, the reference limit for Creatinine is approximately 13% higher for people identified as African-American. .    GFR, Est Non African American 62 > OR = 60 mL/min/1.97m2   GFR, Est African American 72 > OR = 60 mL/min/1.77m2   BUN/Creatinine Ratio 25 (H) 6 - 22 (calc)   Sodium 140 135 - 146 mmol/L   Potassium 5.3 3.5 - 5.3 mmol/L   Chloride 106 98 - 110 mmol/L   CO2 26 20 - 32 mmol/L   Calcium 9.3 8.6 - 10.3 mg/dL   Total Protein 6.7 6.1 - 8.1 g/dL   Albumin 4.3 3.6 - 5.1 g/dL   Globulin 2.4 1.9 - 3.7 g/dL (calc)   AG Ratio 1.8 1.0 - 2.5 (calc)   Total Bilirubin 0.5 0.2 - 1.2 mg/dL   Alkaline phosphatase (APISO) 54 35 - 144 U/L   AST 18 10 - 35 U/L   ALT 20 9 - 46 U/L    On 10/22/2016 his CMP showed normal renal function, islet cell antibodies where undetectable, GAD 65 antibody is also negative.    Assessment & Plan:   1. Uncontrolled type 2 diabetes mellitus  without long-term current use of insulin ([place])  - Patient has currently controlled asymptomatic type 2 DM since  68 years of age. -His previsit labs show A1c of 7.1% improving from 9%.  He reports blood glucose average of 135-150 over the last 10 days.  - Recent labs reviewed.   His diabetes is complicated by retinopathy, nephropathy,   and patient remains at a high risk for more acute and chronic complications of diabetes which include CAD, CVA, CKD, retinopathy, and neuropathy. These are all discussed in detail with the patient.  - I have counseled the patient on diet management ,  by adopting a carbohydrate  restricted/protein rich diet.  - he  admits there is a room for improvement in his diet and drink choices. -  Suggestion is made for him to avoid simple carbohydrates  from his diet including Cakes, Sweet Desserts / Pastries, Ice Cream, Soda (diet and regular), Sweet Tea, Candies, Chips, Cookies, Sweet Pastries,  Store Bought Juices, Alcohol in Excess of  1-2 drinks a day, Artificial Sweeteners, Coffee Creamer, and "Sugar-free" Products. This will help patient to have stable blood glucose profile and potentially avoid unintended weight gain.   - I encouraged the patient to switch to  unprocessed or minimally processed complex starch and increased protein intake (animal or plant source), fruits, and vegetables.  - Patient is advised to stick to a routine mealtimes to eat 3 meals  a day and avoid unnecessary snacks ( to snack only to correct hypoglycemia).    - I have approached patient with the following individualized plan to manage diabetes and patient agrees:   -He has achieved reasonable control of diabetes since last visit with A1c of 7.1%, he would not insulin treatment for now.     Per his report he has never been overweight or obese, denies any history of pancreatitis nor heavy alcohol intake. - His presentation is not typical type 2 nor type 1 diabetes. He denies any history of diabetic ketoacidosis. His GAD 65 antibodies and anti-islet cell antibodies aree undetectable - suggesting the likelihood of type 2 diabetes rather than type 1.  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  He is advised to maintain adequate hydration.  -He is advised to continue metformin 1000 mg p.o. twice daily-after breakfast and after supper, and Januvia 50 mg p.o. daily at breakfast.   - Patient is not suitable candidate for Victoza, Byetta, nor other injectable incretin therapy.   2) BP/HTN: he is advised to home monitor blood pressure and report if > 140/90 on 2 separate  readings.     He is advised to continue his current blood pressure medications including lisinopril 20 mg p.o. Daily.  3) Lipids/HPL: His recent lipid panel showed controlled LDL at 58.  He is advised to continue Lipitor 20 mg p.o. nightly.     - I advised patient to maintain close follow up with Sharilyn Sites, MD for primary care needs.  - Patient Care Time Today:  25 min, of which >50% was spent in  counseling and the rest reviewing his  current and  previous labs/studies, previous treatments, his blood glucose readings, and medications' doses and developing a plan for long-term care based on the latest recommendations for standards of care.   Eric Guzman participated in the discussions, expressed understanding, and voiced agreement with the above plans.  All questions were answered to his satisfaction. he is encouraged to contact clinic should he have any questions or concerns prior to his return visit.  Follow up plan: - Return in about 4 months (around 08/12/2019) for Next Visit A1c in Office.  Glade Lloyd, MD Phone: [phone]  Fax: [phone]  This note was partially dictated with voice recognition software. Similar sounding words can be transcribed inadequately or may not  be corrected upon review.  04/11/2019, 6:51 PM

## 2019-04-20 DIAGNOSIS — Z1389 Encounter for screening for other disorder: Secondary | ICD-10-CM | POA: Diagnosis not present

## 2019-04-20 DIAGNOSIS — I10 Essential (primary) hypertension: Secondary | ICD-10-CM | POA: Diagnosis not present

## 2019-04-20 DIAGNOSIS — Z6824 Body mass index (BMI) 24.0-24.9, adult: Secondary | ICD-10-CM | POA: Diagnosis not present

## 2019-04-20 DIAGNOSIS — E119 Type 2 diabetes mellitus without complications: Secondary | ICD-10-CM | POA: Diagnosis not present

## 2019-04-20 DIAGNOSIS — Z0000 Encounter for general adult medical examination without abnormal findings: Secondary | ICD-10-CM | POA: Diagnosis not present

## 2019-04-20 DIAGNOSIS — E785 Hyperlipidemia, unspecified: Secondary | ICD-10-CM | POA: Diagnosis not present

## 2019-05-09 DIAGNOSIS — U071 COVID-19: Secondary | ICD-10-CM | POA: Diagnosis not present

## 2019-05-22 DIAGNOSIS — I10 Essential (primary) hypertension: Secondary | ICD-10-CM | POA: Diagnosis not present

## 2019-05-22 DIAGNOSIS — E785 Hyperlipidemia, unspecified: Secondary | ICD-10-CM | POA: Diagnosis not present

## 2019-05-22 DIAGNOSIS — E11319 Type 2 diabetes mellitus with unspecified diabetic retinopathy without macular edema: Secondary | ICD-10-CM | POA: Diagnosis not present

## 2019-08-15 ENCOUNTER — Ambulatory Visit: Payer: Medicare HMO | Admitting: "Endocrinology

## 2020-02-15 ENCOUNTER — Other Ambulatory Visit: Payer: Self-pay

## 2020-02-15 ENCOUNTER — Encounter (INDEPENDENT_AMBULATORY_CARE_PROVIDER_SITE_OTHER): Payer: Medicare HMO | Admitting: Ophthalmology

## 2020-02-15 DIAGNOSIS — I10 Essential (primary) hypertension: Secondary | ICD-10-CM | POA: Diagnosis not present

## 2020-02-15 DIAGNOSIS — E11311 Type 2 diabetes mellitus with unspecified diabetic retinopathy with macular edema: Secondary | ICD-10-CM | POA: Diagnosis not present

## 2020-02-15 DIAGNOSIS — E113312 Type 2 diabetes mellitus with moderate nonproliferative diabetic retinopathy with macular edema, left eye: Secondary | ICD-10-CM

## 2020-02-15 DIAGNOSIS — H43813 Vitreous degeneration, bilateral: Secondary | ICD-10-CM

## 2020-02-15 DIAGNOSIS — E113391 Type 2 diabetes mellitus with moderate nonproliferative diabetic retinopathy without macular edema, right eye: Secondary | ICD-10-CM

## 2020-02-15 DIAGNOSIS — H35033 Hypertensive retinopathy, bilateral: Secondary | ICD-10-CM

## 2020-02-15 DIAGNOSIS — D3132 Benign neoplasm of left choroid: Secondary | ICD-10-CM

## 2020-02-19 ENCOUNTER — Encounter (INDEPENDENT_AMBULATORY_CARE_PROVIDER_SITE_OTHER): Payer: Medicare HMO | Admitting: Ophthalmology

## 2020-02-19 ENCOUNTER — Other Ambulatory Visit: Payer: Self-pay

## 2020-02-19 DIAGNOSIS — E113312 Type 2 diabetes mellitus with moderate nonproliferative diabetic retinopathy with macular edema, left eye: Secondary | ICD-10-CM | POA: Diagnosis not present

## 2020-02-19 DIAGNOSIS — E11311 Type 2 diabetes mellitus with unspecified diabetic retinopathy with macular edema: Secondary | ICD-10-CM

## 2020-03-18 ENCOUNTER — Other Ambulatory Visit: Payer: Self-pay

## 2020-03-18 ENCOUNTER — Encounter (INDEPENDENT_AMBULATORY_CARE_PROVIDER_SITE_OTHER): Payer: Medicare HMO | Admitting: Ophthalmology

## 2020-03-18 DIAGNOSIS — E113312 Type 2 diabetes mellitus with moderate nonproliferative diabetic retinopathy with macular edema, left eye: Secondary | ICD-10-CM

## 2020-03-18 DIAGNOSIS — E11311 Type 2 diabetes mellitus with unspecified diabetic retinopathy with macular edema: Secondary | ICD-10-CM | POA: Diagnosis not present

## 2020-03-18 DIAGNOSIS — I10 Essential (primary) hypertension: Secondary | ICD-10-CM | POA: Diagnosis not present

## 2020-03-18 DIAGNOSIS — D3132 Benign neoplasm of left choroid: Secondary | ICD-10-CM

## 2020-03-18 DIAGNOSIS — H35033 Hypertensive retinopathy, bilateral: Secondary | ICD-10-CM

## 2020-03-18 DIAGNOSIS — E113391 Type 2 diabetes mellitus with moderate nonproliferative diabetic retinopathy without macular edema, right eye: Secondary | ICD-10-CM

## 2020-03-18 DIAGNOSIS — H43813 Vitreous degeneration, bilateral: Secondary | ICD-10-CM

## 2020-04-16 ENCOUNTER — Other Ambulatory Visit: Payer: Self-pay

## 2020-04-16 ENCOUNTER — Encounter (INDEPENDENT_AMBULATORY_CARE_PROVIDER_SITE_OTHER): Payer: Medicare HMO | Admitting: Ophthalmology

## 2020-04-16 DIAGNOSIS — E113391 Type 2 diabetes mellitus with moderate nonproliferative diabetic retinopathy without macular edema, right eye: Secondary | ICD-10-CM

## 2020-04-16 DIAGNOSIS — E11311 Type 2 diabetes mellitus with unspecified diabetic retinopathy with macular edema: Secondary | ICD-10-CM | POA: Diagnosis not present

## 2020-04-16 DIAGNOSIS — E113312 Type 2 diabetes mellitus with moderate nonproliferative diabetic retinopathy with macular edema, left eye: Secondary | ICD-10-CM

## 2020-04-16 DIAGNOSIS — I10 Essential (primary) hypertension: Secondary | ICD-10-CM

## 2020-04-16 DIAGNOSIS — H35033 Hypertensive retinopathy, bilateral: Secondary | ICD-10-CM

## 2020-05-01 ENCOUNTER — Encounter: Payer: Self-pay | Admitting: Internal Medicine

## 2020-05-13 ENCOUNTER — Encounter (INDEPENDENT_AMBULATORY_CARE_PROVIDER_SITE_OTHER): Payer: Medicare HMO | Admitting: Ophthalmology

## 2020-05-13 ENCOUNTER — Other Ambulatory Visit: Payer: Self-pay

## 2020-05-13 DIAGNOSIS — E11311 Type 2 diabetes mellitus with unspecified diabetic retinopathy with macular edema: Secondary | ICD-10-CM | POA: Diagnosis not present

## 2020-05-13 DIAGNOSIS — D3132 Benign neoplasm of left choroid: Secondary | ICD-10-CM

## 2020-05-13 DIAGNOSIS — H43813 Vitreous degeneration, bilateral: Secondary | ICD-10-CM

## 2020-05-13 DIAGNOSIS — E113312 Type 2 diabetes mellitus with moderate nonproliferative diabetic retinopathy with macular edema, left eye: Secondary | ICD-10-CM | POA: Diagnosis not present

## 2020-05-13 DIAGNOSIS — I10 Essential (primary) hypertension: Secondary | ICD-10-CM

## 2020-05-13 DIAGNOSIS — E113391 Type 2 diabetes mellitus with moderate nonproliferative diabetic retinopathy without macular edema, right eye: Secondary | ICD-10-CM

## 2020-05-13 DIAGNOSIS — D3131 Benign neoplasm of right choroid: Secondary | ICD-10-CM

## 2020-05-13 DIAGNOSIS — H35033 Hypertensive retinopathy, bilateral: Secondary | ICD-10-CM

## 2020-05-28 ENCOUNTER — Ambulatory Visit (INDEPENDENT_AMBULATORY_CARE_PROVIDER_SITE_OTHER): Payer: Self-pay | Admitting: *Deleted

## 2020-05-28 ENCOUNTER — Other Ambulatory Visit: Payer: Self-pay

## 2020-05-28 VITALS — Ht 66.0 in | Wt 163.8 lb

## 2020-05-28 DIAGNOSIS — Z1211 Encounter for screening for malignant neoplasm of colon: Secondary | ICD-10-CM

## 2020-05-28 MED ORDER — PEG 3350-KCL-NA BICARB-NACL 420 G PO SOLR: 4000.0000 mL | Freq: Once | ORAL | 0 refills | Status: AC

## 2020-05-28 NOTE — Progress Notes (Signed)
Gastroenterology Pre-Procedure Review  Request Date: 05/28/2020 Requesting Physician: 5 year recall, Last TCS 04/11/2014 done by Dr. Gala Romney, normal colon, family hx of colon cancer (father)  PATIENT REVIEW QUESTIONS: The patient responded to the following health history questions as indicated:    1. Diabetes Melitis: yes, type II 2. Joint replacements in the past 12 months: no 3. Major health problems in the past 3 months: no 4. Has an artificial valve or MVP: no 5. Has a defibrillator: no 6. Has been advised in past to take antibiotics in advance of a procedure like teeth cleaning: no 7. Family history of colon cancer: yes, father: age 73  8. Alcohol Use: no 9. Illicit drug Use: no 10. History of sleep apnea: no  11. History of coronary artery or other vascular stents placed within the last 12 months: no 12. History of any prior anesthesia complications: no 13. Body mass index is 26.44 kg/m.    MEDICATIONS & ALLERGIES:    Patient reports the following regarding taking any blood thinners:   Plavix? no Aspirin? no Coumadin? no Brilinta? no Xarelto? no Eliquis? no Pradaxa? no Savaysa? no Effient? no  Patient confirms/reports the following medications:  Current Outpatient Medications  Medication Sig Dispense Refill  . atorvastatin (LIPITOR) 20 MG tablet Take 20 mg by mouth daily.    Marland Kitchen glimepiride (AMARYL) 2 MG tablet Take 2 mg by mouth daily.    Marland Kitchen lisinopril (ZESTRIL) 20 MG tablet TAKE ONE (1) TABLET BY MOUTH EVERY DAY 90 tablet 0  . metFORMIN (GLUCOPHAGE) 1000 MG tablet TAKE ONE TABLET BY MOUTH TWICE A DAY 180 tablet 1  . Multiple Vitamin (MULTIVITAMIN) tablet Take 1 tablet by mouth daily. WITH IRON    . pantoprazole (PROTONIX) 40 MG tablet Take 1 tablet (40 mg total) by mouth daily. 90 tablet 0  . sitaGLIPtin (JANUVIA) 50 MG tablet TAKE ONE (1) TABLET BY MOUTH EVERY DAY (Patient taking differently: in the morning and at bedtime. ) 30 tablet 2   No current  facility-administered medications for this visit.    Patient confirms/reports the following allergies:  No Known Allergies  No orders of the defined types were placed in this encounter.   AUTHORIZATION INFORMATION Primary Insurance: [employer],  ID #Fermin Schwab,  Group #: [id number] Pre-Cert / Josem Kaufmann required: No, not required  SCHEDULE INFORMATION: Procedure has been scheduled as follows:  Date: 07/24/2020, Time: 7:30 Location: [facility] with Dr. Gala Romney  This Gastroenterology Pre-Precedure Review Form is being routed to the following provider(s): Walden Field, NP

## 2020-05-28 NOTE — Patient Instructions (Addendum)
Eric Guzman   1950/07/11 MRN: [id number] Procedure Date: 09/11/2020  Arrival Time:   You will receive a call from the hospital a few days before your procedure.    Location of Procedure: [facility]  PREPARATION FOR COLONOSCOPY WITH TRI-LYTE PREP  Please notify us immediately if you are diabetic, take iron supplements, or if you are on Coumadin or any other blood thinners.   Please hold the following medications:   DM meds- Amaryl, glucophage, januvia: none the morning of procedure.   PROCEDURE IS SCHEDULED FOR Eric Guzman AS FOLLOWS:  Procedure Date: 09/11/2020  Time to register: You will receive a call from the hospital a few days before your procedure. Place to register: [facility] Scheduled provider: Dr. Gala Romney   2 DAYS BEFORE PROCEDURE:  DATE: 09/09/2020   DAY: Monday Begin clear liquid diet AFTER your lunch meal. NO SOLID FOODS!   1 DAY BEFORE PROCEDURE:  DATE: 09/10/2020  DAY: Tuesday  Continue clear liquids the entire day - NO SOLID FOOD.   Diabetic medications adjustments for today: See letter.  At 12:00pm (noon): Take 2 (two) Dulcolax (Bisacodyl) tablets  At 2:00pm: Start drinking your solution. Try to drink 1 (one) 8 ounce glass every 10-15 minutes, until you have consumed HALF the jug. (You should complete the first 1/2 of the jug in 2 hours. Wait 30 minutes, then drink 3-4 more glasses of the solution. Your stools should be clear; if not, you may have to consume the rest of the jug.   One hour after completing the solution: take the last 2 (two) Dulcolax (Bisacodyl) tablets, with a clear liquid.  YOU MUST DRINK PLENTY OF CLEAR LIQUIDS DURING YOUR PREP TO REDUCE RISKS OF KIDNEY FAILURE.   Continue clear liquids only, until midnight. Do not eat or drink anything after midnight.  EXCEPTION:  If you take medications for your heart, blood pressure or breathing, you may take these medications with a small amount of clear liquid.      DAY OF  PROCEDURE:   DATE: 09/11/2020       DAY: Wednesday The morning of your procedure give yourself 1 (one) Fleet Enema, at least 1 hour before going to the hospital.   You may take Tylenol products. Please continue your regular medications unless we have instructed otherwise.   Diabetic medications adjustments for today.   DM meds- Amaryl, glucophage, januvia: none the morning of procedure.  Someone MUST be available to drive you home; the hospital will cancel this appointment if you do not have a driver.   Please call the office if you have any questions (Dept: [phone]).  Please see below for Dietary Information.  CLEAR LIQUIDS INCLUDE:  Water Jello (NOT red in color)   Ice Popsicles (NOT red in color)   Tea (sugar ok, no milk/cream) Powdered fruit flavored drinks  Coffee (sugar ok, no milk/cream) Gatorade/ Lemonade/ Kool-Aid  (NOT red in color)   Juice: apple, white grape, white cranberry Soft drinks  Clear bullion, consomme, broth (fat free beef/chicken/vegetable)  Carbonated beverages (any kind)  Strained chicken noodle soup Hard Candy   REMEMBER: Clear liquids are liquids that will allow you to see your fingers on the other side of a clear glass. Be sure liquids are NOT red in color, and not cloudy, but CLEAR.   DO NOT EAT OR DRINK ANY OF THE FOLLOWING:  Dairy products of any kind   Cranberry juice Tomato juice / V8 juice  Grapefruit juice Orange juice     Red grape juice  Do not eat any solid foods, including such foods as: cereal, oatmeal, yogurt, fruits, vegetables, creamed soups, eggs, bread, etc.    HELPFUL HINTS FOR DRINKING PREP SOLUTION:   Make sure prep is extremely cold. Refrigerate the night before. You may also put in the freezer.   You may try mixing some Crystal Light or Country Time Lemonade if you prefer. Mix in small amounts; add more if necessary.  Try drinking through a straw  Rinse mouth with water or a mouthwash between glasses, to remove  after-taste.  Try sipping on a cold beverage /ice/ popsicles between glasses of prep  Place a piece of sugar-free hard candy in mouth between glasses  If you become nauseated, try consuming smaller amounts, or stretch out the time between glasses. Stop for 30-60 minutes, then slowly start back drinking    You may call the office (Dept: [phone]) before 5:00pm, or page the doctor on call after 5:00pm [phone]), for further instructions, if necessary.   OTHER INSTRUCTIONS  You will need a responsible adult at least 71 years of age to accompany you and drive you home. This person must remain in the waiting room during your procedure.  Wear loose fitting clothing that is easily removed.  Leave jewelry and other valuables at home.   Remove all body piercing jewelry and leave at home.  Total time from sign-in until discharge is approximately 2-3 hours.  You should go home directly after your procedure and rest. You can resume normal activities the day after your procedure.  The day of your procedure you should not:  Drive  Make legal decisions  Operate machinery  Drink alcohol  Return to work

## 2020-05-30 ENCOUNTER — Encounter: Payer: Self-pay | Admitting: *Deleted

## 2020-05-30 NOTE — Progress Notes (Signed)
Ok to schedule.  DM meds- Amaryl, glucophage, januvia: none the morning of  On prep day: Check CBG ac and hs as well (if they normally check their blood sugar) as if the patient feels like their blood sugar is off. Can use soda, juice (that's in [place]) as needed for any low blood sugar.  Check CBG on arrival to endo unit.  ASA II

## 2020-05-30 NOTE — Progress Notes (Addendum)
Pt brought by medication list.  Added Rosuvastatin 20 mg one tablet daily.  Changed Januvia to 100 mg one tablet daily. Routing to Walden Field, NP as Juluis Rainier.

## 2020-05-31 NOTE — Progress Notes (Signed)
Noted. No changes to previous recommendations

## 2020-06-10 ENCOUNTER — Other Ambulatory Visit: Payer: Self-pay

## 2020-06-10 ENCOUNTER — Encounter (INDEPENDENT_AMBULATORY_CARE_PROVIDER_SITE_OTHER): Payer: Medicare HMO | Admitting: Ophthalmology

## 2020-06-10 DIAGNOSIS — E113312 Type 2 diabetes mellitus with moderate nonproliferative diabetic retinopathy with macular edema, left eye: Secondary | ICD-10-CM

## 2020-06-10 DIAGNOSIS — I10 Essential (primary) hypertension: Secondary | ICD-10-CM | POA: Diagnosis not present

## 2020-06-10 DIAGNOSIS — H35033 Hypertensive retinopathy, bilateral: Secondary | ICD-10-CM | POA: Diagnosis not present

## 2020-06-10 DIAGNOSIS — D3132 Benign neoplasm of left choroid: Secondary | ICD-10-CM

## 2020-06-10 DIAGNOSIS — E113391 Type 2 diabetes mellitus with moderate nonproliferative diabetic retinopathy without macular edema, right eye: Secondary | ICD-10-CM

## 2020-06-10 DIAGNOSIS — H43813 Vitreous degeneration, bilateral: Secondary | ICD-10-CM

## 2020-06-10 DIAGNOSIS — D3131 Benign neoplasm of right choroid: Secondary | ICD-10-CM

## 2020-07-15 ENCOUNTER — Encounter (INDEPENDENT_AMBULATORY_CARE_PROVIDER_SITE_OTHER): Payer: Medicare HMO | Admitting: Ophthalmology

## 2020-07-15 ENCOUNTER — Other Ambulatory Visit: Payer: Self-pay

## 2020-07-15 DIAGNOSIS — E113312 Type 2 diabetes mellitus with moderate nonproliferative diabetic retinopathy with macular edema, left eye: Secondary | ICD-10-CM | POA: Diagnosis not present

## 2020-07-15 DIAGNOSIS — E113391 Type 2 diabetes mellitus with moderate nonproliferative diabetic retinopathy without macular edema, right eye: Secondary | ICD-10-CM

## 2020-07-15 DIAGNOSIS — I10 Essential (primary) hypertension: Secondary | ICD-10-CM

## 2020-07-15 DIAGNOSIS — D3132 Benign neoplasm of left choroid: Secondary | ICD-10-CM

## 2020-07-15 DIAGNOSIS — H43813 Vitreous degeneration, bilateral: Secondary | ICD-10-CM

## 2020-07-15 DIAGNOSIS — H35033 Hypertensive retinopathy, bilateral: Secondary | ICD-10-CM

## 2020-07-22 ENCOUNTER — Other Ambulatory Visit (HOSPITAL_COMMUNITY)
Admission: RE | Admit: 2020-07-22 | Discharge: 2020-07-22 | Disposition: A | Discharge status: Home / Self Care | Payer: Medicare HMO | Source: Ambulatory Visit | Attending: Internal Medicine | Admitting: Internal Medicine

## 2020-07-22 ENCOUNTER — Other Ambulatory Visit: Payer: Self-pay

## 2020-07-22 DIAGNOSIS — U071 COVID-19: Secondary | ICD-10-CM | POA: Diagnosis not present

## 2020-07-22 DIAGNOSIS — Z01812 Encounter for preprocedural laboratory examination: Secondary | ICD-10-CM | POA: Diagnosis present

## 2020-07-22 LAB — SARS CORONAVIRUS 2 (TAT 6-24 HRS): SARS Coronavirus 2: POSITIVE — AB

## 2020-07-23 ENCOUNTER — Telehealth (HOSPITAL_COMMUNITY): Payer: Self-pay | Admitting: Adult Health

## 2020-07-23 ENCOUNTER — Encounter: Payer: Self-pay | Admitting: *Deleted

## 2020-07-23 ENCOUNTER — Telehealth: Payer: Self-pay | Admitting: *Deleted

## 2020-07-23 DIAGNOSIS — U071 COVID-19: Secondary | ICD-10-CM

## 2020-07-23 NOTE — Telephone Encounter (Signed)
Spoke with pt yesterday and informed him that we need to reschedule his procedure due to Covid surge.  Rescheduled pt's procedure to 09/11/2020.  Pt made aware that Day Surgery will contact him regarding the time.  Informed him that I will mail out new prep instructions.  Pt voiced understanding.

## 2020-07-23 NOTE — Telephone Encounter (Signed)
Called to discuss with patient about COVID-19 symptoms and the use of one of the available treatments for those with mild to moderate Covid symptoms and at a high risk of hospitalization.  Pt appears to qualify for outpatient treatment due to co-morbid conditions and/or a member of an at-risk group in accordance with the FDA Emergency Use Authorization.    Symptom onset: 07/22/2020 Vaccinated: no Booster? no Immunocompromised? no Qualifiers: diabetes, HTN, age  Patient was recommended to take a [place] pack, which he says has different vitamins in it.  I let him know that there is no strong evidence that vitamins have efficacy in improving COVID19 symptoms.  He meets criteria for treatment and he will call us back if he decides to receive it.     Scot Dock

## 2020-07-23 NOTE — Telephone Encounter (Signed)
Day Surgery called and informed us that pt tested positive for Covid yesterday.  Called pt and informed him.  He is aware to follow up with PCP.  Pt aware that he will not need to do another Covid screening as long as his procedure is done within 90 days from positive Covid result.  Called and made [facility] aware of new procedure date.

## 2020-08-19 ENCOUNTER — Encounter (INDEPENDENT_AMBULATORY_CARE_PROVIDER_SITE_OTHER): Payer: Medicare HMO | Admitting: Ophthalmology

## 2020-08-19 ENCOUNTER — Other Ambulatory Visit: Payer: Self-pay

## 2020-08-19 DIAGNOSIS — H35033 Hypertensive retinopathy, bilateral: Secondary | ICD-10-CM | POA: Diagnosis not present

## 2020-08-19 DIAGNOSIS — I10 Essential (primary) hypertension: Secondary | ICD-10-CM | POA: Diagnosis not present

## 2020-08-19 DIAGNOSIS — D3131 Benign neoplasm of right choroid: Secondary | ICD-10-CM

## 2020-08-19 DIAGNOSIS — D3132 Benign neoplasm of left choroid: Secondary | ICD-10-CM

## 2020-08-19 DIAGNOSIS — E113391 Type 2 diabetes mellitus with moderate nonproliferative diabetic retinopathy without macular edema, right eye: Secondary | ICD-10-CM

## 2020-08-19 DIAGNOSIS — E113312 Type 2 diabetes mellitus with moderate nonproliferative diabetic retinopathy with macular edema, left eye: Secondary | ICD-10-CM | POA: Diagnosis not present

## 2020-08-19 DIAGNOSIS — H43813 Vitreous degeneration, bilateral: Secondary | ICD-10-CM

## 2020-09-09 ENCOUNTER — Other Ambulatory Visit (HOSPITAL_COMMUNITY): Payer: Medicare HMO

## 2020-09-11 ENCOUNTER — Ambulatory Visit (HOSPITAL_COMMUNITY)
Admission: RE | Admit: 2020-09-11 | Discharge: 2020-09-11 | Disposition: A | Discharge status: Home / Self Care | Payer: Medicare HMO | Attending: Internal Medicine | Admitting: Internal Medicine

## 2020-09-11 ENCOUNTER — Encounter (HOSPITAL_COMMUNITY)
Admission: RE | Disposition: A | Discharge status: Home / Self Care | Payer: Self-pay | Source: Home / Self Care | Attending: Internal Medicine

## 2020-09-11 ENCOUNTER — Encounter (HOSPITAL_COMMUNITY): Payer: Self-pay | Admitting: Internal Medicine

## 2020-09-11 ENCOUNTER — Other Ambulatory Visit: Payer: Self-pay

## 2020-09-11 DIAGNOSIS — Z800 Family history of malignant neoplasm of digestive organs: Secondary | ICD-10-CM | POA: Insufficient documentation

## 2020-09-11 DIAGNOSIS — Z833 Family history of diabetes mellitus: Secondary | ICD-10-CM | POA: Diagnosis not present

## 2020-09-11 DIAGNOSIS — K5730 Diverticulosis of large intestine without perforation or abscess without bleeding: Secondary | ICD-10-CM | POA: Insufficient documentation

## 2020-09-11 DIAGNOSIS — Z79899 Other long term (current) drug therapy: Secondary | ICD-10-CM | POA: Insufficient documentation

## 2020-09-11 DIAGNOSIS — Z1211 Encounter for screening for malignant neoplasm of colon: Secondary | ICD-10-CM | POA: Diagnosis not present

## 2020-09-11 DIAGNOSIS — Z7984 Long term (current) use of oral hypoglycemic drugs: Secondary | ICD-10-CM | POA: Insufficient documentation

## 2020-09-11 HISTORY — PX: COLONOSCOPY: SHX5424

## 2020-09-11 LAB — GLUCOSE, CAPILLARY: Glucose-Capillary: 163 mg/dL — ABNORMAL HIGH (ref 70–99)

## 2020-09-11 SURGERY — COLONOSCOPY
Anesthesia: Moderate Sedation

## 2020-09-11 MED ORDER — SODIUM CHLORIDE 0.9 % IV SOLN: INTRAVENOUS | Status: DC

## 2020-09-11 MED ORDER — MIDAZOLAM HCL 5 MG/5ML IJ SOLN
INTRAMUSCULAR | Status: AC
  Filled 2020-09-11: qty 10

## 2020-09-11 MED ORDER — MIDAZOLAM HCL 5 MG/5ML IJ SOLN
INTRAMUSCULAR | Status: DC | PRN
  Administered 2020-09-11: 1 mg via INTRAVENOUS
  Administered 2020-09-11: 2 mg via INTRAVENOUS
  Administered 2020-09-11 (×2): 1 mg via INTRAVENOUS

## 2020-09-11 MED ORDER — MEPERIDINE HCL 50 MG/ML IJ SOLN
INTRAMUSCULAR | Status: AC
  Filled 2020-09-11: qty 1

## 2020-09-11 MED ORDER — ONDANSETRON HCL 4 MG/2ML IJ SOLN
INTRAMUSCULAR | Status: DC | PRN
  Administered 2020-09-11: 4 mg via INTRAVENOUS

## 2020-09-11 MED ORDER — MEPERIDINE HCL 100 MG/ML IJ SOLN
INTRAMUSCULAR | Status: DC | PRN
  Administered 2020-09-11: 25 mg

## 2020-09-11 MED ORDER — ONDANSETRON HCL 4 MG/2ML IJ SOLN
INTRAMUSCULAR | Status: AC
  Filled 2020-09-11: qty 2

## 2020-09-11 NOTE — Discharge Instructions (Signed)
Colonoscopy Discharge Instructions  Read the instructions outlined below and refer to this sheet in the next few weeks. These discharge instructions provide you with general information on caring for yourself after you leave the hospital. Your doctor may also give you specific instructions. While your treatment has been planned according to the most current medical practices available, unavoidable complications occasionally occur. If you have any problems or questions after discharge, call Dr. Gala Romney at [phone]. ACTIVITY  You may resume your regular activity, but move at a slower pace for the next 24 hours.   Take frequent rest periods for the next 24 hours.   Walking will help get rid of the air and reduce the bloated feeling in your belly (abdomen).   No driving for 24 hours (because of the medicine (anesthesia) used during the test).    Do not sign any important legal documents or operate any machinery for 24 hours (because of the anesthesia used during the test).  NUTRITION  Drink plenty of fluids.   You may resume your normal diet as instructed by your doctor.   Begin with a light meal and progress to your normal diet. Heavy or fried foods are harder to digest and may make you feel sick to your stomach (nauseated).   Avoid alcoholic beverages for 24 hours or as instructed.  MEDICATIONS  You may resume your normal medications unless your doctor tells you otherwise.  WHAT YOU CAN EXPECT TODAY  Some feelings of bloating in the abdomen.   Passage of more gas than usual.   Spotting of blood in your stool or on the toilet paper.  IF YOU HAD POLYPS REMOVED DURING THE COLONOSCOPY:  No aspirin products for 7 days or as instructed.   No alcohol for 7 days or as instructed.   Eat a soft diet for the next 24 hours.  FINDING OUT THE RESULTS OF YOUR TEST Not all test results are available during your visit. If your test results are not back during the visit, make an appointment  with your caregiver to find out the results. Do not assume everything is normal if you have not heard from your caregiver or the medical facility. It is important for you to follow up on all of your test results.  SEEK IMMEDIATE MEDICAL ATTENTION IF:  You have more than a spotting of blood in your stool.   Your belly is swollen (abdominal distention).   You are nauseated or vomiting.   You have a temperature over 101.   You have abdominal pain or discomfort that is severe or gets worse throughout the day.   Diverticulosis information provided  Recommend 1 more colonoscopy in 5 years if overall health permits  At patient request, I called Misty at [phone] -discussed findings and recommendations   Diverticulosis  Diverticulosis is a condition that develops when small pouches (diverticula) form in the wall of the large intestine (colon). The colon is where water is absorbed and stool (feces) is formed. The pouches form when the inside layer of the colon pushes through weak spots in the outer layers of the colon. You may have a few pouches or many of them. The pouches usually do not cause problems unless they become inflamed or infected. When this happens, the condition is called diverticulitis. What are the causes? The cause of this condition is not known. What increases the risk? The following factors may make you more likely to develop this condition:  Being older than age 106.  Your risk for this condition increases with age. Diverticulosis is rare among people younger than age 4. By age 76, many people have it.  Eating a low-fiber diet.  Having frequent constipation.  Being overweight.  Not getting enough exercise.  Smoking.  Taking over-the-counter pain medicines, like aspirin and ibuprofen.  Having a family history of diverticulosis. What are the signs or symptoms? In most people, there are no symptoms of this condition. If you do have symptoms, they may  include:  Bloating.  Cramps in the abdomen.  Constipation or diarrhea.  Pain in the lower left side of the abdomen. How is this diagnosed? Because diverticulosis usually has no symptoms, it is most often diagnosed during an exam for other colon problems. The condition may be diagnosed by:  Using a flexible scope to examine the colon (colonoscopy).  Taking an X-ray of the colon after dye has been put into the colon (barium enema).  Having a CT scan. How is this treated? You may not need treatment for this condition. Your health care provider may recommend treatment to prevent problems. You may need treatment if you have symptoms or if you previously had diverticulitis. Treatment may include:  Eating a high-fiber diet.  Taking a fiber supplement.  Taking a live bacteria supplement (probiotic).  Taking medicine to relax your colon.   Follow these instructions at home: Medicines  Take over-the-counter and prescription medicines only as told by your health care provider.  If told by your health care provider, take a fiber supplement or probiotic. Constipation prevention Your condition may cause constipation. To prevent or treat constipation, you may need to:  Drink enough fluid to keep your urine pale yellow.  Take over-the-counter or prescription medicines.  Eat foods that are high in fiber, such as beans, whole grains, and fresh fruits and vegetables.  Limit foods that are high in fat and processed sugars, such as fried or sweet foods.   General instructions  Try not to strain when you have a bowel movement.  Keep all follow-up visits as told by your health care provider. This is important. Contact a health care provider if you:  Have pain in your abdomen.  Have bloating.  Have cramps.  Have not had a bowel movement in 3 days. Get help right away if:  Your pain gets worse.  Your bloating becomes very bad.  You have a fever or chills, and your symptoms  suddenly get worse.  You vomit.  You have bowel movements that are bloody or black.  You have bleeding from your rectum. Summary  Diverticulosis is a condition that develops when small pouches (diverticula) form in the wall of the large intestine (colon).  You may have a few pouches or many of them.  This condition is most often diagnosed during an exam for other colon problems.  Treatment may include increasing the fiber in your diet, taking supplements, or taking medicines. This information is not intended to replace advice given to you by your health care provider. Make sure you discuss any questions you have with your health care provider. Document Revised: 01/05/2019 Document Reviewed: 01/05/2019 Elsevier Patient Education  [employer].

## 2020-09-11 NOTE — Op Note (Signed)
[facility] Patient Name: Eric Guzman Procedure Date: 09/11/2020 11:47 AM MRN: [id number] Date of Birth: 09/05/1950 Attending MD: [name] , MD CSN: [id number] Age: 70 Admit Type: Outpatient Procedure:                Colonoscopy Indications:              Screening in patient at increased risk: Family                            history of 1st-degree relative with colorectal                            cancer Providers:                [name], MD, Janeece Riggers, RN, Raphael Gibney, Technician Referring MD:              Medicines:                Midazolam 5 mg IV, Meperidine 25 mg IV Complications:            No immediate complications. Estimated Blood Loss:     Estimated blood loss: none. Procedure:                Pre-Anesthesia Assessment:                           - Prior to the procedure, a History and Physical                            was performed, and patient medications and                            allergies were reviewed. The patient's tolerance of                            previous anesthesia was also reviewed. The risks                            and benefits of the procedure and the sedation                            options and risks were discussed with the patient.                            All questions were answered, and informed consent                            was obtained. Prior Anticoagulants: The patient has                            taken no previous anticoagulant or antiplatelet  agents. ASA Grade Assessment: II - A patient with                            mild systemic disease. After reviewing the risks                            and benefits, the patient was deemed in                            satisfactory condition to undergo the procedure.                           After obtaining informed consent, the colonoscope                            was passed under direct vision.  Throughout the                            procedure, the patient's blood pressure, pulse, and                            oxygen saturations were monitored continuously. The                            CF-HQ190L ([id number]) scope was introduced through                            the anus and advanced to the the cecum, identified                            by appendiceal orifice and ileocecal valve. The                            colonoscopy was performed without difficulty. The                            patient tolerated the procedure well. The quality                            of the bowel preparation was adequate. Scope In: 12:00:23 PM Scope Out: 12:15:12 PM Scope Withdrawal Time: 0 hours 9 minutes 13 seconds  Total Procedure Duration: 0 hours 14 minutes 49 seconds  Findings:      The perianal and digital rectal examinations were normal.      Scattered small-mouthed diverticula were found in the sigmoid colon and       descending colon.      The exam was otherwise without abnormality on direct and retroflexion       views. Impression:               - Diverticulosis in the sigmoid colon and in the                            descending colon.                           -  The examination was otherwise normal on direct                            and retroflexion views.                           - No specimens collected. Moderate Sedation:      Moderate (conscious) sedation was administered by the endoscopy nurse       and supervised by the endoscopist. The following parameters were       monitored: oxygen saturation, heart rate, blood pressure, respiratory       rate, EKG, adequacy of pulmonary ventilation, and response to care.       Total physician intraservice time was 18 minutes. Recommendation:           - Patient has a contact number available for                            emergencies. The signs and symptoms of potential                            delayed complications were  discussed with the                            patient. Return to normal activities tomorrow.                            Written discharge instructions were provided to the                            patient.                           - Resume previous diet.                           - Continue present medications.                           - Repeat colonoscopy in 5 years for screening                            purposes.                           - Return to GI office (date not yet determined). Procedure Code(s):        --- Professional ---                           [phone], Colonoscopy, flexible; diagnostic, including                            collection of specimen(s) by brushing or washing,                            when performed (separate procedure)  G0500, Moderate sedation services provided by the                            same physician or other qualified health care                            professional performing a gastrointestinal                            endoscopic service that sedation supports,                            requiring the presence of an independent trained                            observer to assist in the monitoring of the                            patient's level of consciousness and physiological                            status; initial 15 minutes of intra-service time;                            patient age 62 years or older (additional time may                            be reported with [phone], as appropriate) Diagnosis Code(s):        --- Professional ---                           Z80.0, Family history of malignant neoplasm of                            digestive organs                           K57.30, Diverticulosis of large intestine without                            perforation or abscess without bleeding CPT copyright 2019 American Medical Association. All rights reserved. The codes documented in this report are  preliminary and upon coder review may  be revised to meet current compliance requirements. [name]. [name], MD [name], MD 09/11/2020 12:23:27 PM This report has been signed electronically. Number of Addenda: 0

## 2020-09-11 NOTE — H&P (Signed)
@LOGO @   Primary Care Physician:  Sharilyn Sites, MD Primary Gastroenterologist:  Dr. Gala Romney  Pre-Procedure History & Physical: HPI:  Eric Guzman is a 70 y.o. male here for screening colonoscopy.  Negative colonoscopy 2015; father with colon cancer.  No bowel symptoms currently.  Past Medical History:  Diagnosis Date  . Anemia   . Diabetes ([place])   . Foley catheter in place 07/21/2016  . Hx of duodenal ulcer 2013  . Hypercholesteremia   . Hypertension     Past Surgical History:  Procedure Laterality Date  . COLONOSCOPY  12/15/2004   RMR:.  Anal papilla.  Otherwise, normal rectum and colon  . COLONOSCOPY  Oct 2011   Dr. Gala Romney: normal  . COLONOSCOPY N/A 04/11/2014   Dr. Gala Romney: Anal papilla and internal hemorroids; otherwise normal ileocolonoscopy  . ESOPHAGOGASTRODUODENOSCOPY N/A 04/11/2014   Dr. Gala Romney: erosive reflux esophagitis.  Gastric ulcer- status post biopsy. duodenal erosions-status post biopsy, negative H.pylori  . ESOPHAGOGASTRODUODENOSCOPY N/A 08/08/2014   Procedure: ESOPHAGOGASTRODUODENOSCOPY (EGD);  Surgeon: Daneil Dolin, MD;  Location[facility];  Service: Endoscopy;  Laterality: N/A;  1000am  . PROSTATE BIOPSY N/A 09/01/2016   Procedure: BIOPSY TRANSRECTAL ULTRASONIC PROSTATE (TUBP);  Surgeon: Irine Seal, MD;  Location: [facility];  Service: Urology;  Laterality: N/A;  . THULIUM LASER TURP (TRANSURETHRAL RESECTION OF PROSTATE) N/A 09/01/2016   Procedure: THULIUM LASER TURP (TRANSURETHRAL RESECTION OF PROSTATE);  Surgeon: Irine Seal, MD;  Location: [facility];  Service: Urology;  Laterality: N/A;    Prior to Admission medications   Medication Sig Start Date End Date Taking? Authorizing Provider  glimepiride (AMARYL) 2 MG tablet Take 2 mg by mouth daily with breakfast. 01/29/20  Yes [provider]  lisinopril (ZESTRIL) 20 MG tablet TAKE ONE (1) TABLET BY MOUTH EVERY DAY Patient taking differently: Take 20 mg by mouth  daily. TAKE ONE (1) TABLET BY MOUTH EVERY DAY 02/07/19  Yes Nida, Marella Chimes, MD  metFORMIN (GLUCOPHAGE) 1000 MG tablet TAKE ONE TABLET BY MOUTH TWICE A DAY Patient taking differently: Take 1,000 mg by mouth 2 (two) times daily with a meal. 10/12/18  Yes Nida, Marella Chimes, MD  Multiple Vitamin (MULTIVITAMIN) tablet Take 1 tablet by mouth daily. WITH IRON   Yes [provider]  pantoprazole (PROTONIX) 40 MG tablet Take 1 tablet (40 mg total) by mouth daily. 03/29/17  Yes Carlis Stable, NP  rosuvastatin (CRESTOR) 20 MG tablet Take 20 mg by mouth daily.   Yes [provider]  sitaGLIPtin (JANUVIA) 100 MG tablet Take 100 mg by mouth daily.   Yes [provider]    Allergies as of 05/30/2020  . (No Known Allergies)    Family History  Problem Relation Age of Onset  . Colon cancer Father        diagnosed in his 35s, deceased in his 96s  . Diabetes Father     Social History   Socioeconomic History  . Marital status: Widowed    Spouse name: Not on file  . Number of children: Not on file  . Years of education: Not on file  . Highest education level: Not on file  Occupational History  . Occupation: retired    Comment: Scientific laboratory technician  . Smoking status: Never Smoker  . Smokeless tobacco: Never Used  Vaping Use  . Vaping Use: Never used  Substance and Sexual Activity  . Alcohol use: No    Alcohol/week: 0.0 standard drinks  .  Drug use: No  . Sexual activity: Not on file  Other Topics Concern  . Not on file  Social History Narrative  . Not on file   Social Determinants of Health   Financial Resource Strain: Not on file  Food Insecurity: Not on file  Transportation Needs: Not on file  Physical Activity: Not on file  Stress: Not on file  Social Connections: Not on file  Intimate Partner Violence: Not on file    Review of Systems: See HPI, otherwise negative ROS  Physical Exam: BP (!) 176/80   Pulse (!) 104   Temp 97.9 F (36.6 C)  (Oral)   Resp 19   Ht 5\' 6"  (1.676 m)   Wt 73.9 kg   SpO2 99%   BMI 26.31 kg/m  General:   Alert,  Well-developed, well-nourished, pleasant and cooperative in NAD Neck:  Supple; no masses or thyromegaly. No significant cervical adenopathy. Lungs:  Clear throughout to auscultation.   No wheezes, crackles, or rhonchi. No acute distress. Heart:  Regular rate and rhythm; no murmurs, clicks, rubs,  or gallops. Abdomen: Non-distended, normal bowel sounds.  Soft and nontender without appreciable mass or hepatosplenomegaly.  Pulses:  Normal pulses noted. Extremities:  Without clubbing or edema.  Impression/Plan: 70 year old woman here for a screening colonoscopy.  Positive family history of colon cancer.  The risks, benefits, limitations, alternatives and imponderables have been reviewed with the patient. Questions have been answered. All parties are agreeable.      Notice: This dictation was prepared with Dragon dictation along with smaller phrase technology. Any transcriptional errors that result from this process are unintentional and may not be corrected upon review.

## 2020-09-18 ENCOUNTER — Encounter (HOSPITAL_COMMUNITY): Payer: Self-pay | Admitting: Internal Medicine

## 2020-10-21 ENCOUNTER — Other Ambulatory Visit: Payer: Self-pay

## 2020-10-21 ENCOUNTER — Encounter (INDEPENDENT_AMBULATORY_CARE_PROVIDER_SITE_OTHER): Payer: Medicare HMO | Admitting: Ophthalmology

## 2020-10-21 DIAGNOSIS — E113393 Type 2 diabetes mellitus with moderate nonproliferative diabetic retinopathy without macular edema, bilateral: Secondary | ICD-10-CM | POA: Diagnosis not present

## 2020-10-21 DIAGNOSIS — D3132 Benign neoplasm of left choroid: Secondary | ICD-10-CM

## 2020-10-21 DIAGNOSIS — I10 Essential (primary) hypertension: Secondary | ICD-10-CM | POA: Diagnosis not present

## 2020-10-21 DIAGNOSIS — H35033 Hypertensive retinopathy, bilateral: Secondary | ICD-10-CM

## 2020-10-21 DIAGNOSIS — H43813 Vitreous degeneration, bilateral: Secondary | ICD-10-CM

## 2020-12-16 ENCOUNTER — Other Ambulatory Visit: Payer: Self-pay

## 2020-12-16 ENCOUNTER — Encounter (INDEPENDENT_AMBULATORY_CARE_PROVIDER_SITE_OTHER): Payer: Medicare HMO | Admitting: Ophthalmology

## 2020-12-16 DIAGNOSIS — I10 Essential (primary) hypertension: Secondary | ICD-10-CM | POA: Diagnosis not present

## 2020-12-16 DIAGNOSIS — E113393 Type 2 diabetes mellitus with moderate nonproliferative diabetic retinopathy without macular edema, bilateral: Secondary | ICD-10-CM

## 2020-12-16 DIAGNOSIS — H35033 Hypertensive retinopathy, bilateral: Secondary | ICD-10-CM | POA: Diagnosis not present

## 2020-12-16 DIAGNOSIS — D3132 Benign neoplasm of left choroid: Secondary | ICD-10-CM

## 2020-12-16 DIAGNOSIS — H43813 Vitreous degeneration, bilateral: Secondary | ICD-10-CM

## 2021-02-10 ENCOUNTER — Encounter (INDEPENDENT_AMBULATORY_CARE_PROVIDER_SITE_OTHER): Payer: Medicare HMO | Admitting: Ophthalmology

## 2021-02-10 ENCOUNTER — Other Ambulatory Visit: Payer: Self-pay

## 2021-02-10 DIAGNOSIS — H43813 Vitreous degeneration, bilateral: Secondary | ICD-10-CM

## 2021-02-10 DIAGNOSIS — E113391 Type 2 diabetes mellitus with moderate nonproliferative diabetic retinopathy without macular edema, right eye: Secondary | ICD-10-CM

## 2021-02-10 DIAGNOSIS — I10 Essential (primary) hypertension: Secondary | ICD-10-CM

## 2021-02-10 DIAGNOSIS — E113292 Type 2 diabetes mellitus with mild nonproliferative diabetic retinopathy without macular edema, left eye: Secondary | ICD-10-CM

## 2021-02-10 DIAGNOSIS — D3132 Benign neoplasm of left choroid: Secondary | ICD-10-CM

## 2021-02-10 DIAGNOSIS — H35033 Hypertensive retinopathy, bilateral: Secondary | ICD-10-CM

## 2021-05-05 ENCOUNTER — Encounter (INDEPENDENT_AMBULATORY_CARE_PROVIDER_SITE_OTHER): Payer: Medicare HMO | Admitting: Ophthalmology

## 2021-05-05 ENCOUNTER — Other Ambulatory Visit: Payer: Self-pay

## 2021-05-05 DIAGNOSIS — H2513 Age-related nuclear cataract, bilateral: Secondary | ICD-10-CM

## 2021-05-05 DIAGNOSIS — E113292 Type 2 diabetes mellitus with mild nonproliferative diabetic retinopathy without macular edema, left eye: Secondary | ICD-10-CM | POA: Diagnosis not present

## 2021-05-05 DIAGNOSIS — D3132 Benign neoplasm of left choroid: Secondary | ICD-10-CM

## 2021-05-05 DIAGNOSIS — E113311 Type 2 diabetes mellitus with moderate nonproliferative diabetic retinopathy with macular edema, right eye: Secondary | ICD-10-CM

## 2021-05-05 DIAGNOSIS — H35033 Hypertensive retinopathy, bilateral: Secondary | ICD-10-CM | POA: Diagnosis not present

## 2021-05-05 DIAGNOSIS — H43813 Vitreous degeneration, bilateral: Secondary | ICD-10-CM

## 2021-05-05 DIAGNOSIS — I10 Essential (primary) hypertension: Secondary | ICD-10-CM | POA: Diagnosis not present

## 2021-08-18 ENCOUNTER — Encounter (INDEPENDENT_AMBULATORY_CARE_PROVIDER_SITE_OTHER): Payer: Medicare HMO | Admitting: Ophthalmology

## 2021-08-18 ENCOUNTER — Other Ambulatory Visit: Payer: Self-pay

## 2021-08-18 DIAGNOSIS — E113393 Type 2 diabetes mellitus with moderate nonproliferative diabetic retinopathy without macular edema, bilateral: Secondary | ICD-10-CM | POA: Diagnosis not present

## 2021-08-18 DIAGNOSIS — I10 Essential (primary) hypertension: Secondary | ICD-10-CM | POA: Diagnosis not present

## 2021-08-18 DIAGNOSIS — H35033 Hypertensive retinopathy, bilateral: Secondary | ICD-10-CM | POA: Diagnosis not present

## 2021-08-18 DIAGNOSIS — H2513 Age-related nuclear cataract, bilateral: Secondary | ICD-10-CM

## 2021-08-18 DIAGNOSIS — H43813 Vitreous degeneration, bilateral: Secondary | ICD-10-CM

## 2021-08-18 DIAGNOSIS — D3132 Benign neoplasm of left choroid: Secondary | ICD-10-CM

## 2021-08-18 DIAGNOSIS — D3131 Benign neoplasm of right choroid: Secondary | ICD-10-CM

## 2021-12-08 ENCOUNTER — Encounter (INDEPENDENT_AMBULATORY_CARE_PROVIDER_SITE_OTHER): Payer: Medicare HMO | Admitting: Ophthalmology

## 2021-12-08 DIAGNOSIS — H43813 Vitreous degeneration, bilateral: Secondary | ICD-10-CM

## 2021-12-08 DIAGNOSIS — H35033 Hypertensive retinopathy, bilateral: Secondary | ICD-10-CM | POA: Diagnosis not present

## 2021-12-08 DIAGNOSIS — D3131 Benign neoplasm of right choroid: Secondary | ICD-10-CM | POA: Diagnosis not present

## 2021-12-08 DIAGNOSIS — E113393 Type 2 diabetes mellitus with moderate nonproliferative diabetic retinopathy without macular edema, bilateral: Secondary | ICD-10-CM | POA: Diagnosis not present

## 2021-12-08 DIAGNOSIS — I10 Essential (primary) hypertension: Secondary | ICD-10-CM | POA: Diagnosis not present

## 2021-12-08 DIAGNOSIS — D3132 Benign neoplasm of left choroid: Secondary | ICD-10-CM

## 2022-04-13 ENCOUNTER — Encounter (INDEPENDENT_AMBULATORY_CARE_PROVIDER_SITE_OTHER): Payer: Medicare HMO | Admitting: Ophthalmology

## 2022-04-13 DIAGNOSIS — H35033 Hypertensive retinopathy, bilateral: Secondary | ICD-10-CM

## 2022-04-13 DIAGNOSIS — E113393 Type 2 diabetes mellitus with moderate nonproliferative diabetic retinopathy without macular edema, bilateral: Secondary | ICD-10-CM | POA: Diagnosis not present

## 2022-04-13 DIAGNOSIS — D3131 Benign neoplasm of right choroid: Secondary | ICD-10-CM

## 2022-04-13 DIAGNOSIS — I10 Essential (primary) hypertension: Secondary | ICD-10-CM | POA: Diagnosis not present

## 2022-04-13 DIAGNOSIS — D3132 Benign neoplasm of left choroid: Secondary | ICD-10-CM

## 2022-04-13 DIAGNOSIS — H43813 Vitreous degeneration, bilateral: Secondary | ICD-10-CM

## 2022-09-07 ENCOUNTER — Encounter (INDEPENDENT_AMBULATORY_CARE_PROVIDER_SITE_OTHER): Payer: Medicare HMO | Admitting: Ophthalmology

## 2022-09-07 DIAGNOSIS — H43813 Vitreous degeneration, bilateral: Secondary | ICD-10-CM | POA: Diagnosis not present

## 2022-09-07 DIAGNOSIS — I10 Essential (primary) hypertension: Secondary | ICD-10-CM

## 2022-09-07 DIAGNOSIS — E113393 Type 2 diabetes mellitus with moderate nonproliferative diabetic retinopathy without macular edema, bilateral: Secondary | ICD-10-CM | POA: Diagnosis not present

## 2022-09-07 DIAGNOSIS — H35033 Hypertensive retinopathy, bilateral: Secondary | ICD-10-CM | POA: Diagnosis not present

## 2023-02-01 ENCOUNTER — Encounter (INDEPENDENT_AMBULATORY_CARE_PROVIDER_SITE_OTHER): Payer: Medicare HMO | Admitting: Ophthalmology

## 2023-02-01 DIAGNOSIS — E113393 Type 2 diabetes mellitus with moderate nonproliferative diabetic retinopathy without macular edema, bilateral: Secondary | ICD-10-CM

## 2023-02-01 DIAGNOSIS — Z7984 Long term (current) use of oral hypoglycemic drugs: Secondary | ICD-10-CM

## 2023-02-01 DIAGNOSIS — H35033 Hypertensive retinopathy, bilateral: Secondary | ICD-10-CM | POA: Diagnosis not present

## 2023-02-01 DIAGNOSIS — I10 Essential (primary) hypertension: Secondary | ICD-10-CM

## 2023-02-01 DIAGNOSIS — D3132 Benign neoplasm of left choroid: Secondary | ICD-10-CM

## 2023-02-01 DIAGNOSIS — D3131 Benign neoplasm of right choroid: Secondary | ICD-10-CM

## 2023-02-01 DIAGNOSIS — H43813 Vitreous degeneration, bilateral: Secondary | ICD-10-CM

## 2023-07-05 ENCOUNTER — Encounter (INDEPENDENT_AMBULATORY_CARE_PROVIDER_SITE_OTHER): Payer: Medicare HMO | Admitting: Ophthalmology

## 2023-07-05 DIAGNOSIS — I10 Essential (primary) hypertension: Secondary | ICD-10-CM | POA: Diagnosis not present

## 2023-07-05 DIAGNOSIS — Z7984 Long term (current) use of oral hypoglycemic drugs: Secondary | ICD-10-CM | POA: Diagnosis not present

## 2023-07-05 DIAGNOSIS — E113393 Type 2 diabetes mellitus with moderate nonproliferative diabetic retinopathy without macular edema, bilateral: Secondary | ICD-10-CM

## 2023-07-05 DIAGNOSIS — H43813 Vitreous degeneration, bilateral: Secondary | ICD-10-CM

## 2023-07-05 DIAGNOSIS — H35033 Hypertensive retinopathy, bilateral: Secondary | ICD-10-CM | POA: Diagnosis not present

## 2023-07-05 DIAGNOSIS — D3131 Benign neoplasm of right choroid: Secondary | ICD-10-CM

## 2023-07-05 DIAGNOSIS — D3132 Benign neoplasm of left choroid: Secondary | ICD-10-CM

## 2024-01-03 ENCOUNTER — Encounter (INDEPENDENT_AMBULATORY_CARE_PROVIDER_SITE_OTHER): Payer: Medicare HMO | Admitting: Ophthalmology

## 2024-01-03 DIAGNOSIS — H35033 Hypertensive retinopathy, bilateral: Secondary | ICD-10-CM

## 2024-01-03 DIAGNOSIS — H43813 Vitreous degeneration, bilateral: Secondary | ICD-10-CM

## 2024-01-03 DIAGNOSIS — I10 Essential (primary) hypertension: Secondary | ICD-10-CM

## 2024-01-03 DIAGNOSIS — D3131 Benign neoplasm of right choroid: Secondary | ICD-10-CM

## 2024-01-03 DIAGNOSIS — E113393 Type 2 diabetes mellitus with moderate nonproliferative diabetic retinopathy without macular edema, bilateral: Secondary | ICD-10-CM

## 2024-01-03 DIAGNOSIS — D3132 Benign neoplasm of left choroid: Secondary | ICD-10-CM

## 2024-01-03 DIAGNOSIS — Z794 Long term (current) use of insulin: Secondary | ICD-10-CM

## 2024-01-03 DIAGNOSIS — H2513 Age-related nuclear cataract, bilateral: Secondary | ICD-10-CM

## 2024-07-03 ENCOUNTER — Encounter (INDEPENDENT_AMBULATORY_CARE_PROVIDER_SITE_OTHER): Admitting: Ophthalmology

## 2024-07-03 DIAGNOSIS — Z7984 Long term (current) use of oral hypoglycemic drugs: Secondary | ICD-10-CM | POA: Diagnosis not present

## 2024-07-03 DIAGNOSIS — H43813 Vitreous degeneration, bilateral: Secondary | ICD-10-CM

## 2024-07-03 DIAGNOSIS — H35033 Hypertensive retinopathy, bilateral: Secondary | ICD-10-CM

## 2024-07-03 DIAGNOSIS — E113393 Type 2 diabetes mellitus with moderate nonproliferative diabetic retinopathy without macular edema, bilateral: Secondary | ICD-10-CM | POA: Diagnosis not present

## 2024-07-03 DIAGNOSIS — I10 Essential (primary) hypertension: Secondary | ICD-10-CM | POA: Diagnosis not present

## 2024-07-03 DIAGNOSIS — H2513 Age-related nuclear cataract, bilateral: Secondary | ICD-10-CM | POA: Diagnosis not present

## 2024-07-03 DIAGNOSIS — D3132 Benign neoplasm of left choroid: Secondary | ICD-10-CM | POA: Diagnosis not present

## 2024-07-28 ENCOUNTER — Encounter: Payer: Self-pay | Admitting: Gastroenterology

## 2024-09-12 ENCOUNTER — Ambulatory Visit: Admitting: Gastroenterology

## 2025-01-01 ENCOUNTER — Encounter (INDEPENDENT_AMBULATORY_CARE_PROVIDER_SITE_OTHER): Admitting: Ophthalmology
